# Patient Record
Sex: Female | Born: 1965 | Race: White | Hispanic: No | Marital: Married | Smoking: Never smoker
Health system: Southern US, Community
[De-identification: ages and names within clinical notes are randomized; demographics above are authoritative.]

## PROBLEM LIST (undated history)

## (undated) DIAGNOSIS — F329 Major depressive disorder, single episode, unspecified: Secondary | ICD-10-CM

## (undated) DIAGNOSIS — M722 Plantar fascial fibromatosis: Secondary | ICD-10-CM

## (undated) DIAGNOSIS — F988 Other specified behavioral and emotional disorders with onset usually occurring in childhood and adolescence: Secondary | ICD-10-CM

## (undated) DIAGNOSIS — J45909 Unspecified asthma, uncomplicated: Secondary | ICD-10-CM

## (undated) DIAGNOSIS — F32A Depression, unspecified: Secondary | ICD-10-CM

## (undated) DIAGNOSIS — K219 Gastro-esophageal reflux disease without esophagitis: Secondary | ICD-10-CM

## (undated) DIAGNOSIS — E785 Hyperlipidemia, unspecified: Secondary | ICD-10-CM

## (undated) DIAGNOSIS — G43909 Migraine, unspecified, not intractable, without status migrainosus: Secondary | ICD-10-CM

## (undated) DIAGNOSIS — IMO0001 Reserved for inherently not codable concepts without codable children: Secondary | ICD-10-CM

## (undated) HISTORY — DX: Depression, unspecified: F32.A

## (undated) HISTORY — PX: HEMORROIDECTOMY: SUR656

## (undated) HISTORY — DX: Gastro-esophageal reflux disease without esophagitis: K21.9

## (undated) HISTORY — DX: Hyperlipidemia, unspecified: E78.5

## (undated) HISTORY — DX: Other specified behavioral and emotional disorders with onset usually occurring in childhood and adolescence: F98.8

## (undated) HISTORY — PX: ABDOMINAL HYSTERECTOMY: SHX81

## (undated) HISTORY — PX: COLONOSCOPY: SHX174

## (undated) HISTORY — DX: Reserved for inherently not codable concepts without codable children: IMO0001

## (undated) HISTORY — PX: TONSILLECTOMY: SUR1361

## (undated) HISTORY — DX: Plantar fascial fibromatosis: M72.2

## (undated) HISTORY — PX: CHOLECYSTECTOMY: SHX55

## (undated) HISTORY — DX: Major depressive disorder, single episode, unspecified: F32.9

## (undated) HISTORY — DX: Migraine, unspecified, not intractable, without status migrainosus: G43.909

---

## 1998-03-23 ENCOUNTER — Other Ambulatory Visit: Admission: RE | Admit: 1998-03-23 | Discharge: 1998-03-23 | Payer: Self-pay | Admitting: Obstetrics and Gynecology

## 1998-09-05 ENCOUNTER — Ambulatory Visit (HOSPITAL_COMMUNITY): Admission: RE | Admit: 1998-09-05 | Discharge: 1998-09-05 | Payer: Self-pay | Admitting: Obstetrics and Gynecology

## 1998-11-15 ENCOUNTER — Inpatient Hospital Stay (HOSPITAL_COMMUNITY): Admission: AD | Admit: 1998-11-15 | Discharge: 1998-11-17 | Payer: Self-pay | Admitting: Obstetrics and Gynecology

## 1998-12-13 ENCOUNTER — Other Ambulatory Visit: Admission: RE | Admit: 1998-12-13 | Discharge: 1998-12-13 | Payer: Self-pay | Admitting: Obstetrics and Gynecology

## 1999-12-30 ENCOUNTER — Other Ambulatory Visit: Admission: RE | Admit: 1999-12-30 | Discharge: 1999-12-30 | Payer: Self-pay | Admitting: Gynecology

## 2001-01-11 ENCOUNTER — Other Ambulatory Visit: Admission: RE | Admit: 2001-01-11 | Discharge: 2001-01-11 | Payer: Self-pay | Admitting: Gynecology

## 2001-03-31 ENCOUNTER — Inpatient Hospital Stay (HOSPITAL_COMMUNITY): Admission: AD | Admit: 2001-03-31 | Discharge: 2001-03-31 | Payer: Self-pay | Admitting: Obstetrics & Gynecology

## 2001-04-13 ENCOUNTER — Ambulatory Visit (HOSPITAL_COMMUNITY): Admission: RE | Admit: 2001-04-13 | Discharge: 2001-04-13 | Payer: Self-pay | Admitting: Obstetrics and Gynecology

## 2001-07-15 ENCOUNTER — Inpatient Hospital Stay (HOSPITAL_COMMUNITY): Admission: AD | Admit: 2001-07-15 | Discharge: 2001-07-15 | Payer: Self-pay | Admitting: Obstetrics and Gynecology

## 2001-08-13 ENCOUNTER — Inpatient Hospital Stay (HOSPITAL_COMMUNITY): Admission: AD | Admit: 2001-08-13 | Discharge: 2001-08-13 | Payer: Self-pay | Admitting: Obstetrics and Gynecology

## 2001-09-13 ENCOUNTER — Inpatient Hospital Stay (HOSPITAL_COMMUNITY): Admission: AD | Admit: 2001-09-13 | Discharge: 2001-09-15 | Payer: Self-pay | Admitting: Obstetrics & Gynecology

## 2002-03-07 ENCOUNTER — Other Ambulatory Visit: Admission: RE | Admit: 2002-03-07 | Discharge: 2002-03-07 | Payer: Self-pay | Admitting: Gynecology

## 2002-08-22 ENCOUNTER — Other Ambulatory Visit: Admission: RE | Admit: 2002-08-22 | Discharge: 2002-08-22 | Payer: Self-pay | Admitting: Dermatology

## 2003-03-30 ENCOUNTER — Other Ambulatory Visit: Admission: RE | Admit: 2003-03-30 | Discharge: 2003-03-30 | Payer: Self-pay | Admitting: Gynecology

## 2004-04-01 ENCOUNTER — Other Ambulatory Visit: Admission: RE | Admit: 2004-04-01 | Discharge: 2004-04-01 | Payer: Self-pay | Admitting: Obstetrics and Gynecology

## 2004-07-15 ENCOUNTER — Other Ambulatory Visit: Admission: RE | Admit: 2004-07-15 | Discharge: 2004-07-15 | Payer: Self-pay | Admitting: Dermatology

## 2005-03-19 ENCOUNTER — Ambulatory Visit (HOSPITAL_COMMUNITY): Admission: RE | Admit: 2005-03-19 | Discharge: 2005-03-19 | Payer: Self-pay | Admitting: Family Medicine

## 2005-04-25 ENCOUNTER — Other Ambulatory Visit: Admission: RE | Admit: 2005-04-25 | Discharge: 2005-04-25 | Payer: Self-pay | Admitting: Obstetrics and Gynecology

## 2005-04-28 ENCOUNTER — Other Ambulatory Visit: Admission: RE | Admit: 2005-04-28 | Discharge: 2005-04-28 | Payer: Self-pay | Admitting: Obstetrics and Gynecology

## 2005-11-07 ENCOUNTER — Encounter (INDEPENDENT_AMBULATORY_CARE_PROVIDER_SITE_OTHER): Payer: Self-pay | Admitting: Specialist

## 2005-11-07 ENCOUNTER — Inpatient Hospital Stay (HOSPITAL_COMMUNITY): Admission: RE | Admit: 2005-11-07 | Discharge: 2005-11-09 | Payer: Self-pay | Admitting: Obstetrics and Gynecology

## 2008-10-18 ENCOUNTER — Encounter (HOSPITAL_COMMUNITY): Admission: RE | Admit: 2008-10-18 | Discharge: 2008-11-17 | Payer: Self-pay | Admitting: Podiatry

## 2010-01-17 ENCOUNTER — Encounter: Payer: Self-pay | Admitting: Internal Medicine

## 2010-01-18 ENCOUNTER — Encounter: Payer: Self-pay | Admitting: Internal Medicine

## 2010-02-21 ENCOUNTER — Encounter: Payer: Self-pay | Admitting: Internal Medicine

## 2010-02-27 DIAGNOSIS — R002 Palpitations: Secondary | ICD-10-CM | POA: Insufficient documentation

## 2010-02-27 DIAGNOSIS — I498 Other specified cardiac arrhythmias: Secondary | ICD-10-CM

## 2010-02-27 DIAGNOSIS — I059 Rheumatic mitral valve disease, unspecified: Secondary | ICD-10-CM | POA: Insufficient documentation

## 2010-02-28 ENCOUNTER — Ambulatory Visit: Payer: Self-pay | Admitting: Internal Medicine

## 2010-12-15 ENCOUNTER — Encounter: Payer: Self-pay | Admitting: Podiatry

## 2010-12-26 NOTE — Assessment & Plan Note (Signed)
Summary: NEP/H/O SVT   Visit Type:  Initial Consult Primary Provider:  Lubertha South, MD   History of Present Illness: [name] is referred by Dr. Lubertha South for evaluation of palpitations and SVT.  She has had tachypalpitations dating back almost 20 yrs.  About 6 yrs ago she was working at [employer] and had a bad spell and was documented.  By her report it was SVT at 170/min. which stopped spontaneously.  She also notes an episode 4 weeks ago where she had several days of palpitations which were different then her prior ones in that they appeared to last a few seconds or minutes at a time, and seemed irregular.  These have stopped.  She was given metoprolol to take as needed and this resulted in severe fatigue.  She feels light headed and has chest pressure when she experiences her SVT.  Problems Prior to Update: 1)  Mitral Valve Prolapse  (ICD-424.0) 2)  Supraventricular Tachycardia  (ICD-427.89) 3)  Palpitations  (ICD-785.1)  Current Medications (verified): 1)  Prilosec 20 Mg Cpdr (Omeprazole) .... Take One Tablet By Mouth Once Daily. 2)  Bupropion Hcl 300 Mg Xr24h-Tab (Bupropion Hcl) .... Take One Tablet By Mouth Once Daily. 3)  Adderall Xr 20 Mg Xr24h-Cap (Amphetamine-Dextroamphetamine) .... Take One Tablet By Mouth Once Daily.  Allergies (verified): 1)  ! Vicodin  Past History:  Past Medical History: Last updated: 02/27/2010 Current Problems:  MITRAL VALVE PROLAPSE (ICD-424.0) SUPRAVENTRICULAR TACHYCARDIA (ICD-427.89) PALPITATIONS (ICD-785.1)    Past Surgical History: Last updated: 02/27/2010  1.  Status post laparoscopy x2 for infertility (1995, 1997).  2.  Status post cholecystectomy in 2001.  3.  Status post hemorrhoidectomy.  4.  Status post tonsillectomy in 1971.  Family History: Last updated: 02/27/2010  Coronary artery disease in the patient's mother, maternal  grandmother, maternal grandfather. Diabetes in the patient's mother,  maternal  grandmother, and maternal uncle.  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  She denies the use of tobacco, alcohol, or illicit drugs.  Review of Systems       All systems reviewed and negative except as noted in the HPI.  Vital Signs:  Patient profile:   45 year old female Height:      63 inches Weight:      154 pounds BMI:     27.38 Pulse rate:   83 / minute BP sitting:   136 / 82  (left arm)  Vitals Entered By: Laurance Flatten CMA (February 28, 2010 8:54 AM)  Physical Exam  General:  Well developed, well nourished, in no acute distress.  HEENT: normal Neck: supple. No JVD. Carotids 2+ bilaterally no bruits Cor: RRR no rubs, gallops or murmur Lungs: CTA Ab: soft, nontender. nondistended. No HSM. Good bowel sounds Ext: warm. no cyanosis, clubbing or edema Neuro: alert and oriented. Grossly nonfocal. affect pleasant    EKG  Procedure date:  02/28/2010  Findings:      Normal sinus rhythm with rate of: 83.  Insignificant T-wave inversion noted:     Impression & Recommendations:  Problem # 1:  SUPRAVENTRICULAR TACHYCARDIA (ICD-427.89) The patient is not particularly symptomatic at this time.  I do not have a documented episode of SVT.  Because of side effects on metoprolol, I have asked her to try atenolo on a as needed basis. I will see her back in several months. Her updated medication list for this problem includes:  Atenolol 25 Mg Tabs (Atenolol) .Marland Kitchen... Take one tablet by mouth daily  Orders: EKG w/ Interpretation ([address])  Problem # 2:  PALPITATIONS (ICD-785.1) Her episode last month is suggestive of pac's or pvc's thought I could not rule out atrial fibrillation. Her updated medication list for this problem includes:    Atenolol 25 Mg Tabs (Atenolol) .Marland Kitchen... Take one tablet by mouth daily  Patient Instructions: 1)  Your physician recommends that you schedule a follow-up appointment in: 4 months in the  [place] office. 2)  Your physician has recommended you make the following change in your medication: Start Atenolol 25mg  once daily as needed for your symptoms. Prescriptions: ATENOLOL 25 MG TABS (ATENOLOL) Take one tablet by mouth daily  #30 x 3   Entered by:   Sherri Rad, RN, BSN   Authorized by:   Laren Boom, MD, [facility]   Signed by:   Sherri Rad, RN, BSN on 02/28/2010   Method used:   Electronically to        [facility] (retail)       [address]       [place], [place]  [address]       Ph: [phone] or [phone]       Fax: [phone]   RxID:   [id number]

## 2010-12-26 NOTE — Consult Note (Signed)
Summary: [facility] Referral Form  [facility] Referral Form   Imported By: Roderic Ovens 03/07/2010 13:45:58  _____________________________________________________________________  External Attachment:    Type:   Image     Comment:   External Document

## 2011-04-11 NOTE — H&P (Signed)
NAMELAURELIN, [name]            ACCOUNT NO.:  [id number]   MEDICAL RECORD NO.:  [id number]          PATIENT TYPE:  INP   LOCATION:  NA                            [facility]:  [facility]   PHYSICIAN:  [name], M.D.   DATE OF BIRTH:  November 21, 1966   DATE OF ADMISSION:  11/07/2005  DATE OF DISCHARGE:                                HISTORY & PHYSICAL   Physical examination was performed October 21, 2005.   HISTORY OF PRESENT ILLNESS:  The patient is a 45 year old gravida 4, para 2,  0, 2, 2, Caucasian female who presents with a complaint of urinary  incontinence and irregular vaginal bleeding. The patient reports a history  of irregular vaginal bleeding with menses that can last up to 13 days. The  patient has tried multiple birth control pills and has problems with break-  through bleeding. She had Gonorrhea/Chlamydia cervical cultures which were  both negative on April 25, 2005. She had an ultrasound performed June 02, 2005  which documented a normal uterus with an endometrial stripe measuring 0.39  cm. The ovaries were unremarkable. An endometrial biopsy performed October 21, 2005 documented proliferative endometrium with no evidence of  hyperplasia nor malignancy.   The patient also reports urinary leakage with coughing and sneezing and  stressful maneuvers. She did undergo multi-channel urodynamic testing which  documented a uroflow study with a void of 485 cc and a post void residual of  170 cc. The pattern was intermittent. On the cystometric study, the patient  had a leak point pressure of 108 cm of water. The detrusor was noted to be  unstable. On her pressure flow study she was noted to have a pattern of  valsalva voiding. Her maximum detrusor pressure was 59 cm of water.   The patient declines any future childbearing and she desires definitive  treatment for her irregular vaginal bleeding and her urinary incontinence.   The patient's past obstetric and gynecologic histories  are remarkable for  two vaginal deliveries (1999, 2002). The patient is status post spontaneous  abortion in 56. She is status post voluntary interruption of pregnancy in  1996 for a fetus with Turner's syndrome and cystic hygroma.   The patient's last Pap smear was performed April 25, 2005 and was within  normal limits. Her last mammogram was performed April 25, 2005 and was also  within normal limits.   PAST MEDICAL HISTORY:  1.  Paroxysmal supraventricular tachycardia. The patient also has a history      of mild anterior mitral valve prolapse with mild regurgitation. This was      confirmed by a recent echocardiogram on September 29, 2005 by the      patient's cardiologist, Dr. Laural Golden.  The patient is also status post cardiac catheterization which was normal.  1.  Migraine headaches.  2.  Gastroesophageal reflux disease.  3.  History of gestational diabetes.  4.  Adult attention deficit disorder and anxiety.   PAST SURGICAL HISTORY:  1.  Status post laparoscopy x2 for infertility (1995, 1997).  2.  Status post cholecystectomy in 2001.  3.  Status post hemorrhoidectomy.  4.  Status post tonsillectomy in 1971.   MEDICATIONS:  1.  Prilosec.  2.  Effexor XR 225 mg p.o. daily.  3.  Baclofen p.r.n.  4.  Migraine headache medications for which the patient does not remember      the name.   ALLERGIES:  TERA ZOLE, VICODIN, CLOMID.   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  She denies the use of tobacco, alcohol, or illicit drugs.   FAMILY HISTORY:  Coronary artery disease in the patient's mother, maternal  grandmother, maternal grandfather. Diabetes in the patient's mother,  maternal grandmother, and maternal uncle.   PHYSICAL EXAMINATION:  VITAL SIGNS:  Height 5 feet, 3 inches, weight 168  pounds.  HEENT:  Normocephalic, atraumatic.  LUNGS:  Clear to auscultation bilaterally.  HEART:  S1, S2 with a regular rate and rhythm.  ABDOMEN:   Soft and nontender and without evidence of hepatosplenomegaly or  organomegaly.  PELVIC EXAMINATION:  Normal external genitalia and urethra. There is a  second degree cystocele, first degree uterine prolapse, and a second degree  rectocele. The uterus is noted to be small and nontender and there is no  evidence of any adnexal masses, no tenderness.   IMPRESSION:  The patient is a 45 year old para 2 female with pelvic organ  prolapse, genuine stress incontinence, and a history of irregular bleeding  which has not been well controlled with birth control pills. The patient  also has a history of supraventricular tachycardia with mild mitral valve  prolapse and regurgitation.   PLAN:  The patient will undergo a total vaginal hysterectomy with TVT,  cystoscopy, and anterior and posterior colporrhaphy at [facility] on November 07, 2005. The patient will be pre-treated with  both ampicillin and gentamicin for mitral valve prolapse. Risks, benefits,  and alternatives to the surgery have been discussed with the patient who  wishes to proceed.      [name], M.D.  Electronically Signed     BES/MEDQ  D:  11/06/2005  T:  11/06/2005  Job:  [id number]

## 2011-04-11 NOTE — Op Note (Signed)
NAMERANYIA, WITTING            ACCOUNT NO.:  [id number]   MEDICAL RECORD NO.:  [id number]          PATIENT TYPE:  INP   LOCATION:  9399                          [facility]ILITY:  [facility]   PHYSICIAN:  Randye Lobo, M.D.   DATE OF BIRTH:  1966/03/06   DATE OF PROCEDURE:  11/07/2005  DATE OF DISCHARGE:                                 OPERATIVE REPORT   PREOPERATIVE DIAGNOSIS:  1.  Genuine stress incontinence.  2.  Incomplete uterovaginal prolapse.  3.  Irregular vaginal bleeding.   POSTOPERATIVE DIAGNOSIS:  1.  Genuine stress incontinence.  2.  Pelvic organ prolapse, incomplete uterovaginal prolapse.  3.  Irregular vaginal bleeding.   PROCEDURE:  Total vaginal hysterectomy, McCall's culdoplasty, anterior and  posterior colporrhaphy, Tension Free Vaginal Tape and Cystoscopy   SURGEON:  Conley Simmonds, M.D.   ASSISTANT:  Carrington Clamp, M.D.   ANESTHESIA:  General endotracheal, local with 0.5% lidocaine with 1:200,000  of epinephrine.   IV FLUIDS:  2500 mL Ringer's lactate.   ESTIMATED BLOOD LOSS:  300 mL.   URINE OUTPUT:  500 mL.   INDICATIONS FOR PROCEDURE:  The patient is a 45 year old gravida 4, para 2-0-  2-2 Caucasian female who presented with urinary incontinence and irregular  vaginal bleeding. The patient had multichannel urodynamic testing performed  which documented genuine stress incontinence. She had a complete workup done  for her irregular vaginal bleeding which was negative. The patient has  continued to have irregular vaginal bleeding with first degree uterine  prolapse and a second degree cystocele and rectocele.   The patient declined any future childbearing and she wished for definitive  surgical treatment of the prolapse, incontinence, and bleeding and a plan  was made to proceed after risks, benefits, and alternatives were discussed  with her.   FINDINGS:  Examination revealed a second-degree uterine prolapse, second-  degree rectocele, and a second  degree cystocele. At the time of hysterectomy  the patient was noted to have a normal uterus, tubes and ovaries. There was  a 1 cm left corpus luteum cyst appreciated. Cystoscopy both during and after  completion of the tension-free vaginal tape sling documented the absence of  a foreign body in either the urethra or the bladder. The ureters were noted  to be patent bilaterally after the injection of indigo carmine dye. The  bladder was visualized throughout 360 degrees and had a normal bladder dome  and a valve prolapse with mild regurgitation.    The patient was reidentified in the pre-op hold area.  She received  Ampicilin and Gentamicin for Mitral Valve Prolapse.  The patient also  received both TED hose and PAS stockings for DVT prophylaxis.   In the operating room, general endotracheal anesthesia was induced and the  patient was then placed in the dorsal lithotomy position. The lower abdomen  and the perineum were then sterilely prepped and draped and a Foley catheter  was placed inside the bladder.   A weighted speculum was placed inside the vagina and a single-tooth  tenaculum and a Jacobs tenaculum where placed on the cervical lips.  The  cervix  was then injected with an 0.5% lidocaine with 1:200,000 of  epinephrine. The cervix was then circumscribed with the scalpel. A Mayo  scissors was used to carry the dissection down to the level of the  endopelvic fascia along the cervix.  The posterior cul de sac was entered  sharply and digital exam confirmed proper entry into this space.  A suture  was used to tag the peritoneum to the posterior vagina when the speculum was  then placed inside the posterior cul-de-sac. The uterosacral ligaments were  sequentially clamped, sharply divided, and suture ligated with a transfixing  suture of 0-0 Vicryl. A second clamp was placed across the upper portion of  the uterosacral ligaments bilaterally and again these were sharply divided  and  suture ligated with 0 Vicryl. The cardinal ligaments and the uterine  arteries were then clamped, sharply divided, and suture ligated with 0  Vicryl. The inferior aspects of the broad ligaments were then clamped,  sharply divided, and again suture ligated with 0 Vicryl bilaterally. The  utero-ovarian ligaments, fallopian tubes and the round ligaments were then  clamped bilaterally, sharply divided, and free tied with 0/0 vicryl and then  suture ligated with the same for excellent hemostasis. The specimen was  removed and was sent to pathology. There was some bleeding noted along the  vagina at the level of the uterosacral ligaments bilaterally. This responded  to cautery on the patient's left-hand side. On the patient's right-hand  side, a right angle clamp was placed and the tissue just above the right  uterosacral ligament was first free tied and was then suture ligated with a  figure-of-eight suture of 0 Vicryl to create good hemostasis.   The posterior vaginal cuff was then whip stitched with 0 Vicryl.  A McCall  culdoplasty was performed next with a suture of 0 Vicryl. This was brought  through the vagina and into the posterior cul-de-sac at the 6 o'clock  position, up through the uterosacral ligament on the patient's left-hand  side, across the posterior cul-de-sac in a pursestring fashion and then down  through the uterosacral ligaments on the patient's right-hand side before  coming out of the vagina at the 6 o'clock position. The suture was held  until the end of the case at which time it was tied for excellent elevation  and support of the vaginal cuff.   The anterior colporrhaphy and the TVT were performed next. The peritoneum  was not closed prior to this. The vaginal mucosa was marked with Allis  clamps in the midline from the level of the vagina all the way up to  approximately 1.5 cm below the urethral meatus. The vaginal tissue was injected with 0.5% lidocaine was  1:200,000 of epinephrine. The vagina was  then incised vertically in the midline with a Metzenbaum scissors. The  subvaginal tissue and bladder were dissected off of the overlying vaginal  mucosa bilaterally all the way back to the pubic rami and down to the level  of the upper pedicles for the vaginal hysterectomy. Hemostasis was created.   The TVT was performed next. The small 1-cm suprapubic incisions were then  created at 2 cm to the right and the left of the midline using a scalpel.  With the Foley catheter in place, the right needle passer was placed first.  The TVT was performed in a top-down fashion. The abdominal needle passer was  placed through the right suprapubic incision and out through the vagina  lateral to the  urethra at its mid portion. The same procedure that was  performed on the patient's right-hand side was then repeated on the left-  hand side, again in a top-down fashion. The Foley catheter was removed and  cystoscopy was performed and the findings were as noted above. The  cystoscopy fluid was then drained from the bladder and the Foley catheter  was replaced and the sling was attached to the abdominal needle passers and  drawn up through the suprapubic incisions bilaterally. The abdominal needle  passers were cut free from the sling. The Foley catheter was removed and the  final cystoscopy was performed. There was no evidence of a foreign body in  the bladder or urethra. Again the cystoscopy fluid was drained and the Foley  catheter was replaced. The plastic sheaths were removed from the sling as a  Kelly clamp was placed underneath the sling between the sling and the  urethra. The excess sling material was then excised and the suprapubic  incisions were closed with Dermabond. The anterior colporrhaphy was  performed by placing a series of first 2-0 Vicryl vertical mattress sutures  which transitioned to 0 Vicryl vertical mattress sutures to reduce the  cystocele.  Excess anterior vaginal mucosa was then excised and the anterior  vaginal wall including the vagina were then closed with a running locked  suture of 2-0 Vicryl.   The posterior colporrhaphy was performed last. Allis clamps were again used  to mark the midline of the posterior vaginal wall. The perineum and the  posterior vaginal mucosa were injected with half percent lidocaine with  1:200,000 epinephrine. A triangular wedge of epithelium was removed from the  perineal body and the posterior vagina was incised vertically using the  Metzenbaum scissors. The perirectal fascia was dissected off of the  overlying vaginal mucosa bilaterally all the way to the level of the [employer] (just below this). The posterior colporrhaphy was performed by  placing a small pursestring suture as the first suture of the colporrhaphy.  2-0 Vicryl was then used to place vertical mattress sutures to reduce the rectocele. At the level of the mid posterior vagina vertical mattress  sutures of 0 Vicryl were used to reduce the rectocele. Excess vaginal mucosa  was then trimmed and the posterior vagina was closed with a running locked  suture of 2-0 Vicryl. A crown stitch of 0 Vicryl was placed at the perineal  body. The closure along the perineum was a subcuticular closure as for an  episiotomy. The knot was tied at the level of the hymen. The McCall  culdoplasty suture was tied at this time for excellent elevation and support  of the vaginal cuff.   Final rectal exam documented the absence of sutures in the rectum. A packing  with Estrace cream was then placed inside the vagina. The patient was  cleansed of any remaining Betadine and taken out of the dorsal lithotomy  position. She was awakened and extubated and escorted to the recovery room  in stable and awake condition. There were no complications to the procedure.  All needle, instrument, and sponge counts were correct.      Randye Lobo,  M.D.  Electronically Signed     BES/MEDQ  D:  11/07/2005  T:  11/09/2005  Job:  [id number]

## 2011-04-11 NOTE — Discharge Summary (Signed)
[name], [name]            ACCOUNT NO.:  [id number]   MEDICAL RECORD NO.:  [id number]          PATIENT TYPE:  INP   LOCATION:  9319                          FACILITY:  [facility]   PHYSICIAN:  Randye Lobo, M.D.   DATE OF BIRTH:  Aug 06, 1966   DATE OF ADMISSION:  11/07/2005  DATE OF DISCHARGE:  11/09/2005                                 DISCHARGE SUMMARY   ADMISSION DIAGNOSES:  1.  Genuine stress incontinence.  2.  Incomplete uterovaginal prolapse.  3.  Irregular vaginal bleeding.   POSTOPERATIVE DIAGNOSES:  1.  Genuine stress incontinence.  2.  Incomplete uterovaginal prolapse.  3.  Irregular vaginal bleeding.  4.  Postoperative urinary retention.   SIGNIFICANT OPERATIONS AND PROCEDURES:  The patient underwent a total  vaginal hysterectomy with tension-free vaginal tape, cystoscopy, McCall  culdoplasty, and anterior and posterior colporrhaphy under the direction of  Randye Lobo, M.D., and with the assistance of Carrington Clamp, M.D., at  the [facility] of [place] one November 07, 2005.   ADMISSION HISTORY AND PHYSICAL EXAMINATION:  The patient is a 45 year old  gravida 4, para 2-0-2-2, Caucasian female who presented with urinary  incontinence and irregular vaginal bleeding.  The patient had urinary  leakage with coughing and sneezing on stressful maneuvers, and she had in  the genuine stress incontinence confirmed with urodynamic testing.  The  patient had a workup of her irregular vaginal bleeding, which was also  negative.  The patient had negative cervical cultures and a negative  ultrasound.  An endometrial biopsy documented proliferative endometrium with  no evidence of hyperplasia nor malignancy.  The patient's bleeding was not  controlled well with oral contraceptives.   The patient requested definitive surgical treatment of her prolapse and  incontinence and as she declined future childbearing, a plan was made to  proceed with a total vaginal  hysterectomy with TVT, cystoscopy, anterior and  posterior colporrhaphy and McCall culdoplasty.   ADMISSION PELVIC EXAMINATION:  Normal external genitalia and urethra.  There  was a second-degree cystocele, first-degree uterine prolapse, and a second  degree rectocele.  The uterus was small and nontender, and there were no  adnexal masses or tenderness appreciated.   HOSPITAL COURSE:  The patient was admitted on November 07, 2005, at which  time she underwent a total vaginal hysterectomy with Rogelio Seen culdoplasty,  TVT, cystoscopy, and anterior and posterior colporrhaphy, which was  uncomplicated.  Findings at surgery document a second-degree cystocele,  second-degree uterine prolapse, and a second-degree rectocele.  The uterus,  tubes and ovaries were unremarkable.  There was a small left corpus luteum  cyst.  Cystoscopy documented the absence of a foreign body in the urethra or  bladder, and the ureters were patent bilaterally.   Postoperatively, the patient's pain was controlled with a morphine PCA and  Toradol.  She was converted over to oral pain medication by postoperative  day #1.  The patient began her bladder training on postoperative day #1, and  she had good voided volumes but high residuals of several hundred  milliliters.  The Foley catheter was therefore placed overnight, the patient  began doing her voiding trials again on postoperative day #2.  Again her  postvoid residuals were noted to be high and she was experiencing some  dysuria from the catheterizations, and the process was therefore  discontinued and she was placed on oral Macrobid, which she will continue at  the time of her discharge.   By the time of the patient's discharge, she was ambulating independently and  she was tolerating a regular diet.   Her discharge hemoglobin was noted to be 11.8.  She did have a leukocytosis  of 18,000, which was of an unknown etiology.  The patient had received  ampicillin and  gentamicin preoperatively and then oral amoxicillin  postoperatively for mitral valve prolapse with mild regurgitation.  She was  afebrile throughout her hospitalization and demonstrated no signs of obvious  infection.   The patient was noted to be in good condition and ready for discharge on  postop day #2.   DISCHARGE INSTRUCTIONS:  1.  Discharge to home.  2.  The patient will take Percocet 5/325 mg one to two p.o. q.4-6h. p.r.n.      pain.  She will also take ibuprofen 600 mg q.6h. p.r.n. pain.  Macrobid      100 mg p.o. b.i.d..  3.  She will leave her Foley catheter to gravity drainage.  4.  The patient will follow a regular diet.  5.  The patient had decreased activity.  She will not drive for two weeks.  6.  The patient will follow up in the office the day after her discharge.  7.  The patient will call if she experiences any problems with fever, pain      uncontrolled by her medication, nausea and vomiting, heavy vaginal      bleeding, hematuria, or any other concern.      Randye Lobo, M.D.  Electronically Signed     BES/MEDQ  D:  12/15/2005  T:  12/16/2005  Job:  [id number]

## 2011-07-21 ENCOUNTER — Other Ambulatory Visit: Payer: Self-pay | Admitting: Obstetrics and Gynecology

## 2013-04-08 ENCOUNTER — Ambulatory Visit (INDEPENDENT_AMBULATORY_CARE_PROVIDER_SITE_OTHER): Payer: BC Managed Care – PPO | Admitting: Family Medicine

## 2013-04-08 ENCOUNTER — Encounter: Payer: Self-pay | Admitting: Family Medicine

## 2013-04-08 VITALS — BP 110/78 | Temp 97.9°F | Ht 63.0 in | Wt 153.0 lb

## 2013-04-08 DIAGNOSIS — J322 Chronic ethmoidal sinusitis: Secondary | ICD-10-CM

## 2013-04-08 MED ORDER — CEFPROZIL 500 MG PO TABS: 500.0000 mg | ORAL_TABLET | Freq: Two times a day (BID) | ORAL | Status: DC

## 2013-04-08 NOTE — Progress Notes (Signed)
  Subjective:    Patient ID: [name], female    DOB: [birth year], 47 y.o.   MRN: [id number]  Sinusitis This is a recurrent problem. The current episode started 1 to 4 weeks ago. The problem has been gradually worsening since onset. There has been no fever. The pain is moderate. Associated symptoms include a hoarse voice, sinus pressure, sneezing and a sore throat (started with sore throatr--took zyrtec plus flonase). Past treatments include nothing.   Used inhaler one night. Tightness at times.   Review of Systems  HENT: Positive for sore throat (started with sore throatr--took zyrtec plus flonase), hoarse voice, sneezing and sinus pressure.   most sym upper othrwise neg ROS otherwise negative    Objective:   Physical Exam Alert mild malaise. Vitals reviewed. HEENT moderate nasal congestion frontal tenderness. Pharynx normal neck supple. Lungs clear. Heart regular in rhythm.       Assessment & Plan:  Impression sinusitis with reactive airways. Plan albuterol when necessary. Cefzil 500 by mouth twice a day 10 days. Symptomatic care discussed. WSL

## 2013-04-08 NOTE — Patient Instructions (Signed)
Tale all the antibiotics

## 2013-05-11 ENCOUNTER — Encounter: Payer: Self-pay | Admitting: *Deleted

## 2013-05-12 ENCOUNTER — Encounter: Payer: Self-pay | Admitting: Family Medicine

## 2013-05-12 ENCOUNTER — Ambulatory Visit (INDEPENDENT_AMBULATORY_CARE_PROVIDER_SITE_OTHER): Payer: BC Managed Care – PPO | Admitting: Family Medicine

## 2013-05-12 VITALS — BP 112/80 | HR 88 | Wt 149.0 lb

## 2013-05-12 DIAGNOSIS — K219 Gastro-esophageal reflux disease without esophagitis: Secondary | ICD-10-CM | POA: Insufficient documentation

## 2013-05-12 DIAGNOSIS — F329 Major depressive disorder, single episode, unspecified: Secondary | ICD-10-CM | POA: Insufficient documentation

## 2013-05-12 DIAGNOSIS — F909 Attention-deficit hyperactivity disorder, unspecified type: Secondary | ICD-10-CM

## 2013-05-12 DIAGNOSIS — G43909 Migraine, unspecified, not intractable, without status migrainosus: Secondary | ICD-10-CM | POA: Insufficient documentation

## 2013-05-12 MED ORDER — RIZATRIPTAN BENZOATE 10 MG PO TABS: 10.0000 mg | ORAL_TABLET | ORAL | Status: DC | PRN

## 2013-05-12 NOTE — Progress Notes (Signed)
  Subjective:    Patient ID: [name], female    DOB: 24-Jul-1966, 47 y.o.   MRN: [id number]  HPI Overall doing better with the focusing.  More control of symptoms. No obvious side effects with the medicine. Definitely states the higher dose is helping.  Line dancing on occasion Busy at school--fell off school  Reflux stable as of late--prilosec definitely helped. Sleep ok.   Migraine headaches have been a challenge. Tradename Maxalt seemed to help more, but is extremely costly. Patient would like to go back to try and generic.  Patient reports her mood is stable on the medication. Looking for to have summer off to spend time with family.  Review of Systems No abdominal pain no vomiting no reflux no weight loss or weight gain ROS otherwise negative    Objective:   Physical Exam Alert no acute distress. Vitals stable. HEENT normal. Lungs clear. Heart regular in rhythm. Neuro intact.       Assessment & Plan:  Impression #1 migraine headaches discussed at length. #2 ADHD clinically improved on new dose. #3 reflux stable while on medicines. #4 depression clinically stable. Plan diet exercise discussed in encourage maintain all medications for refills on Adderall XR filled out recheck in 4 months. WSL

## 2013-08-01 ENCOUNTER — Other Ambulatory Visit: Payer: Self-pay | Admitting: *Deleted

## 2013-08-01 MED ORDER — CETIRIZINE HCL 10 MG PO CAPS: 10.0000 mg | ORAL_CAPSULE | Freq: Every day | ORAL | Status: DC

## 2013-08-26 ENCOUNTER — Other Ambulatory Visit: Payer: Self-pay

## 2013-09-05 ENCOUNTER — Other Ambulatory Visit: Payer: Self-pay

## 2013-09-05 ENCOUNTER — Telehealth: Payer: Self-pay | Admitting: Family Medicine

## 2013-09-05 MED ORDER — BUPROPION HCL ER (XL) 300 MG PO TB24: 300.0000 mg | ORAL_TABLET | Freq: Every day | ORAL | Status: DC

## 2013-09-05 NOTE — Telephone Encounter (Signed)
Patient needs Rx for wellbutrin ASAP She has been out all weekend because pharmacy said that she was not in out system, but she is.  She would like someone to clarify with pharmacy the name that we have on file vs. Theirs.  [facility]

## 2013-09-05 NOTE — Telephone Encounter (Signed)
Medication sent in to pharmacy. Patient was notified.  

## 2013-09-12 ENCOUNTER — Ambulatory Visit (INDEPENDENT_AMBULATORY_CARE_PROVIDER_SITE_OTHER): Payer: BC Managed Care – PPO | Admitting: Family Medicine

## 2013-09-12 ENCOUNTER — Encounter: Payer: Self-pay | Admitting: Family Medicine

## 2013-09-12 VITALS — BP 112/74 | Temp 98.1°F | Ht 63.0 in | Wt 153.3 lb

## 2013-09-12 DIAGNOSIS — J019 Acute sinusitis, unspecified: Secondary | ICD-10-CM

## 2013-09-12 MED ORDER — OMEPRAZOLE 20 MG PO CPDR: 20.0000 mg | DELAYED_RELEASE_CAPSULE | Freq: Two times a day (BID) | ORAL | Status: DC

## 2013-09-12 MED ORDER — LEVOFLOXACIN 500 MG PO TABS: 500.0000 mg | ORAL_TABLET | Freq: Every day | ORAL | Status: DC

## 2013-09-12 MED ORDER — BUPROPION HCL ER (XL) 300 MG PO TB24: 300.0000 mg | ORAL_TABLET | Freq: Every day | ORAL | Status: DC

## 2013-09-12 MED ORDER — AMPHETAMINE-DEXTROAMPHET ER 20 MG PO CP24: 40.0000 mg | ORAL_CAPSULE | Freq: Every morning | ORAL | Status: DC

## 2013-09-12 NOTE — Progress Notes (Signed)
  Subjective:    Patient ID: [name], female    DOB: 26-Apr-1966, 47 y.o.   MRN: [id number]  Sinusitis This is a new problem. The current episode started 1 to 4 weeks ago. The problem has been gradually improving since onset. There has been no fever. Associated symptoms include congestion, coughing and sinus pressure. Pertinent negatives include no ear pain or shortness of breath. (Ear congestion) Past treatments include oral decongestants and spray decongestants. The treatment provided mild relief.   Had presyncope spells last week. Patient states she ran out of her Wellbutrin a cause some significant issues she got restarted on Wellbutrin and she's been doing much better. Past medical history benign patient does not smoke   Review of Systems  Constitutional: Negative for fever and activity change.  HENT: Positive for congestion, rhinorrhea and sinus pressure. Negative for ear pain.   Eyes: Negative for discharge.  Respiratory: Positive for cough. Negative for shortness of breath and wheezing.   Cardiovascular: Negative for chest pain.       Objective:   Physical Exam  Constitutional: She is oriented to person, place, and time. She appears well-developed and well-nourished.  HENT:  Head: Normocephalic.  Right Ear: External ear normal.  Left Ear: External ear normal.  Eyes: Pupils are equal, round, and reactive to light.  Neck: Normal range of motion. No thyromegaly present.  Cardiovascular: Normal rate, regular rhythm, normal heart sounds and intact distal pulses.   No murmur heard. Pulmonary/Chest: Effort normal and breath sounds normal. No respiratory distress. She has no wheezes.  Abdominal: Soft. Bowel sounds are normal. She exhibits no distension and no mass. There is no tenderness.  Musculoskeletal: Normal range of motion. She exhibits no edema and no tenderness.  Lymphadenopathy:    She has no cervical adenopathy.  Neurological: She is alert and oriented to  person, place, and time. She exhibits normal muscle tone.  Skin: Skin is warm and dry.  Psychiatric: She has a normal mood and affect. Her behavior is normal.          Assessment & Plan:  Acute sinusitis-antibiotics prescribed. Warning signs discussed. Followup if ongoing issues.  ADD-one additional month was given. To followup by the time this is finishing up for her standard ADD visit.

## 2013-09-16 ENCOUNTER — Other Ambulatory Visit (HOSPITAL_COMMUNITY): Payer: Self-pay | Admitting: Podiatry

## 2013-09-16 DIAGNOSIS — M8430XA Stress fracture, unspecified site, initial encounter for fracture: Secondary | ICD-10-CM

## 2013-09-20 ENCOUNTER — Ambulatory Visit (HOSPITAL_COMMUNITY)
Admission: RE | Admit: 2013-09-20 | Discharge: 2013-09-20 | Disposition: A | Discharge status: Home / Self Care | Payer: BC Managed Care – PPO | Source: Ambulatory Visit | Attending: Podiatry | Admitting: Podiatry

## 2013-09-20 DIAGNOSIS — M9420 Chondromalacia, unspecified site: Secondary | ICD-10-CM | POA: Insufficient documentation

## 2013-09-20 DIAGNOSIS — M8430XA Stress fracture, unspecified site, initial encounter for fracture: Secondary | ICD-10-CM

## 2013-09-20 DIAGNOSIS — M25579 Pain in unspecified ankle and joints of unspecified foot: Secondary | ICD-10-CM | POA: Insufficient documentation

## 2013-10-18 ENCOUNTER — Other Ambulatory Visit: Payer: Self-pay | Admitting: Family Medicine

## 2013-11-04 ENCOUNTER — Ambulatory Visit (INDEPENDENT_AMBULATORY_CARE_PROVIDER_SITE_OTHER): Payer: BC Managed Care – PPO | Admitting: Nurse Practitioner

## 2013-11-04 ENCOUNTER — Encounter: Payer: Self-pay | Admitting: Nurse Practitioner

## 2013-11-04 VITALS — BP 130/70 | Ht 63.0 in | Wt 161.4 lb

## 2013-11-04 DIAGNOSIS — R5381 Other malaise: Secondary | ICD-10-CM

## 2013-11-04 DIAGNOSIS — R300 Dysuria: Secondary | ICD-10-CM

## 2013-11-04 DIAGNOSIS — F909 Attention-deficit hyperactivity disorder, unspecified type: Secondary | ICD-10-CM

## 2013-11-04 LAB — POCT UA - MICROSCOPIC ONLY
Bacteria, U Microscopic: 0
RBC, urine, microscopic: 0
WBC, Ur, HPF, POC: 0

## 2013-11-04 LAB — POCT URINALYSIS DIPSTICK

## 2013-11-04 MED ORDER — AMPHETAMINE-DEXTROAMPHET ER 20 MG PO CP24: 40.0000 mg | ORAL_CAPSULE | Freq: Every morning | ORAL | Status: DC

## 2013-11-04 MED ORDER — NITROFURANTOIN MONOHYD MACRO 100 MG PO CAPS: 100.0000 mg | ORAL_CAPSULE | Freq: Two times a day (BID) | ORAL | Status: DC

## 2013-11-09 ENCOUNTER — Encounter: Payer: Self-pay | Admitting: Nurse Practitioner

## 2013-11-09 NOTE — Assessment & Plan Note (Signed)
Continue Adderall as directed. Recheck in 3 months.

## 2013-11-09 NOTE — Progress Notes (Signed)
Subjective:  Presents complaints of slight drainage most likely from her urethra for over week. No fever or chills. Clear to slightly yellowish in color. Slight urgency and frequency but no dysuria. Minimal lower pelvic area tenderness. Had recent STD testing done for her gynecologist in October which was all negative. No history of recent UTI. No back or flank pain. Has increased her alcohol and sugar intake. Some weight gain. Has been under a lot more stress. No change in the number of her migraines. Also complaints of slight jumping around the eye area. No visual changes. Current dose of Adderall is working well for her ADD. Denies any adverse affects.  Objective:   BP 130/70  Ht 5\' 3"  (1.6 m)  Wt 161 lb 6.4 oz (73.211 kg)  BMI 28.60 kg/m2 NAD. Alert, oriented. Thyroid normal limit to palpation and nontender. Lungs clear. No CVA area tenderness. No flank tenderness. Heart regular rate rhythm. Abdomen soft nondistended nontender. Urine microscopic negative.  Assessment:Dysuria - Plan: POCT urinalysis dipstick, POCT UA - Microscopic Only, Urine culture  Other malaise and fatigue - Plan: TSH  ADHD (attention deficit hyperactivity disorder)   Plan: Meds ordered this encounter  Medications  . DISCONTD: amphetamine-dextroamphetamine (ADDERALL XR) 20 MG 24 hr capsule    Sig: Take 2 capsules (40 mg total) by mouth every morning. Take 2 tablets daily (40 mg)    Dispense:  60 capsule    Refill:  0    Order Specific Question:  Supervising Provider    Answer:  Merlyn Albert [[id number]]  . DISCONTD: amphetamine-dextroamphetamine (ADDERALL XR) 20 MG 24 hr capsule    Sig: Take 2 capsules (40 mg total) by mouth every morning. Take 2 tablets daily (40 mg)    Dispense:  60 capsule    Refill:  0    May fill 30 days from 11/04/13    Order Specific Question:  Supervising Provider    Answer:  Merlyn Albert [[id number]]  . amphetamine-dextroamphetamine (ADDERALL XR) 20 MG 24 hr capsule    Sig: Take 2  capsules (40 mg total) by mouth every morning. Take 2 tablets daily (40 mg)    Dispense:  60 capsule    Refill:  0    May fill 60 days from 11/04/13    Order Specific Question:  Supervising Provider    Answer:  Merlyn Albert [[id number]]  . nitrofurantoin, macrocrystal-monohydrate, (MACROBID) 100 MG capsule    Sig: Take 1 capsule (100 mg total) by mouth 2 (two) times daily.    Dispense:  14 capsule    Refill:  0    Order Specific Question:  Supervising Provider    Answer:  Merlyn Albert [[id number]]   urine culture pending. Recheck here or with gynecologist if her symptoms persist. Recheck in 3 months for ADHD. Discussed importance of stress reduction; this is the most likely cause for twitching in her eye.

## 2014-02-17 ENCOUNTER — Telehealth: Payer: Self-pay | Admitting: Family Medicine

## 2014-02-17 MED ORDER — AMPHETAMINE-DEXTROAMPHET ER 20 MG PO CP24: 40.0000 mg | ORAL_CAPSULE | Freq: Every morning | ORAL | Status: DC

## 2014-02-17 NOTE — Telephone Encounter (Signed)
Ok time one mo ov before further

## 2014-02-17 NOTE — Telephone Encounter (Signed)
Pt needs refill on her Adderall 20mg  XR, takes 2 every morning.  Unable to get appt in before runs out due to our schedule availability.

## 2014-02-20 NOTE — Telephone Encounter (Signed)
Patient notified

## 2014-02-24 ENCOUNTER — Telehealth: Payer: Self-pay | Admitting: Family Medicine

## 2014-02-24 NOTE — Telephone Encounter (Signed)
Rx Prior Auth obtained for amphetamine-dextroamphetamine (ADDERALL XR) 20 MG 24 hr capsule, 2 by mouth every morning, expires 02/24/2015 through [employer], faxed approval to [facility]

## 2014-03-10 ENCOUNTER — Encounter: Payer: Self-pay | Admitting: Family Medicine

## 2014-03-10 ENCOUNTER — Ambulatory Visit (INDEPENDENT_AMBULATORY_CARE_PROVIDER_SITE_OTHER): Payer: BC Managed Care – PPO | Admitting: Family Medicine

## 2014-03-10 VITALS — BP 130/90 | Ht 63.0 in | Wt 159.0 lb

## 2014-03-10 DIAGNOSIS — K219 Gastro-esophageal reflux disease without esophagitis: Secondary | ICD-10-CM

## 2014-03-10 DIAGNOSIS — F32A Depression, unspecified: Secondary | ICD-10-CM

## 2014-03-10 DIAGNOSIS — F3289 Other specified depressive episodes: Secondary | ICD-10-CM

## 2014-03-10 DIAGNOSIS — F329 Major depressive disorder, single episode, unspecified: Secondary | ICD-10-CM

## 2014-03-10 DIAGNOSIS — F909 Attention-deficit hyperactivity disorder, unspecified type: Secondary | ICD-10-CM

## 2014-03-10 DIAGNOSIS — G43909 Migraine, unspecified, not intractable, without status migrainosus: Secondary | ICD-10-CM

## 2014-03-10 DIAGNOSIS — I7300 Raynaud's syndrome without gangrene: Secondary | ICD-10-CM

## 2014-03-10 MED ORDER — AMPHETAMINE-DEXTROAMPHET ER 20 MG PO CP24: 40.0000 mg | ORAL_CAPSULE | Freq: Every morning | ORAL | Status: DC

## 2014-03-10 MED ORDER — CLARITHROMYCIN 500 MG PO TABS: 500.0000 mg | ORAL_TABLET | Freq: Two times a day (BID) | ORAL | Status: DC

## 2014-03-10 NOTE — Progress Notes (Signed)
   Subjective:    Patient ID: [name], female    DOB: Jul 01, 1966, 48 y.o.   MRN: [id number]  HPI Patient is here today to get a refill on her meds.  She also c/o: metallic taste in her mouth, and has metallic tast e in the mount  Neck/jaw pain  Numbness and white fingertips  These symptoms started about 2 months ago   Pale and red and tingly , off and on for five months  Left sided tender node, feels like the chan is tender and sore  Neck was hurting both side of the neck and significant, went to the chiro, didn't help much,  Had no meloxicam ran out  Not exercising regularly  Sore throat and clinching jaw feels tight at t  Hx of difficulties,   Patient states overall her ADHD is under decent control.  Experiences occasional migraine headaches but not very often at this time.  Not exercising regularly these days. Review of Systems No chest pain no headache no back pain no abdominal pain no change in bowel habits no blood in stool    Objective:   Physical Exam Alert no apparent distress mild malaise vital stable blood pressure good on repeat HEENT somewhat tender left anterior cervical node minimally enlarged. Right posterior lateral neck tender both sides tender with rotation. Distal arm strength sensation intact. Peripheral pulses and hands intact.  Pictures of the hands reviewed. With distinct erythema of the hands and blanching of distal fingers       Assessment & Plan:  Impression 1 raynaud's phenomenon versus renounced disease. Discussed #2 migraine headaches clinically stable. #3 ADHD decent control. #4 chronic neck strain discussed seeing chiropractor has not had anti-inflammatory. #5 lymphadenitis. Plan trial of Biaxin twice a day for 10 days. Motrin when necessary. Exercise encourage. Chronic medicines refilled. Appropriate blood work. 35-40 minutes spent most in discussion. WSL

## 2014-03-13 DIAGNOSIS — I7300 Raynaud's syndrome without gangrene: Secondary | ICD-10-CM | POA: Insufficient documentation

## 2014-03-14 ENCOUNTER — Encounter: Payer: Self-pay | Admitting: Family Medicine

## 2014-03-14 LAB — ANA: ANA: NEGATIVE

## 2014-03-14 LAB — RHEUMATOID FACTOR

## 2014-03-14 LAB — SEDIMENTATION RATE: Sed Rate: 1 mm/hr (ref 0–22)

## 2014-03-22 ENCOUNTER — Ambulatory Visit (INDEPENDENT_AMBULATORY_CARE_PROVIDER_SITE_OTHER): Payer: BC Managed Care – PPO | Admitting: Family Medicine

## 2014-03-22 ENCOUNTER — Encounter: Payer: Self-pay | Admitting: Family Medicine

## 2014-03-22 VITALS — BP 122/88 | Ht 63.0 in | Wt 156.0 lb

## 2014-03-22 DIAGNOSIS — R439 Unspecified disturbances of smell and taste: Secondary | ICD-10-CM

## 2014-03-22 DIAGNOSIS — J209 Acute bronchitis, unspecified: Secondary | ICD-10-CM

## 2014-03-22 DIAGNOSIS — J069 Acute upper respiratory infection, unspecified: Secondary | ICD-10-CM

## 2014-03-22 DIAGNOSIS — R438 Other disturbances of smell and taste: Secondary | ICD-10-CM

## 2014-03-22 MED ORDER — ALBUTEROL SULFATE HFA 108 (90 BASE) MCG/ACT IN AERS: 2.0000 | INHALATION_SPRAY | Freq: Four times a day (QID) | RESPIRATORY_TRACT | Status: DC | PRN

## 2014-03-22 MED ORDER — MELOXICAM 15 MG PO TABS: 15.0000 mg | ORAL_TABLET | Freq: Every day | ORAL | Status: DC

## 2014-03-22 MED ORDER — LEVOFLOXACIN 500 MG PO TABS: 500.0000 mg | ORAL_TABLET | Freq: Every day | ORAL | Status: DC

## 2014-03-22 NOTE — Progress Notes (Signed)
   Subjective:    Patient ID: [name], female    DOB: 1966/11/16, 48 y.o.   MRN: [id number]  Cough This is a new problem. The current episode started in the past 7 days. Associated symptoms include chest pain, chills, a fever, headaches and myalgias. Associated symptoms comments: Vomiting, diarrhea, jaw pain, ear pain. Treatments tried: ibuprofen, mucinex. The treatment provided mild relief.   Requesting refill on mobic for neck pain. Med was not sent in at last visit.  Felt bad Sunday and body aches on Tuesday with chest symptoms Deep breath made her cough Tried mucinex C/o bodyaches all over yesterday Highest 99.8 (pt states rare to have fever) Energy low over past 5 days No rashes, no tick bite  No childhood asthma but did have inhaler a few years ago for possible exercise asthma  No smoke exposure  Review of Systems  Constitutional: Positive for fever and chills.  Respiratory: Positive for cough.   Cardiovascular: Positive for chest pain.  Musculoskeletal: Positive for myalgias.  Neurological: Positive for headaches.       Objective:   Physical Exam  Neck no masses eardrums normal throat is normal lungs clear but at the cough is noted not rest for distress heart regular      Assessment & Plan:  Bronchial infection-I. feel she start off with an upper respiratory virus then started moving into her chest causing significant chest tightness along with congestion I would recommend albuterol on a regular basis plus antibiotics. I don't recommend CBC or chest x-ray currently. If she starts having high fevers difficulty breathing or worse followup  She will also keep track of her other symptoms she is having if she keeps having ongoing troubles she needs to discuss these further with Dr. Brett CanalesSteve to see if further testing or possibly subspecialty consultation is indicated. Recent lab work looked good patient was told this.

## 2014-03-28 ENCOUNTER — Ambulatory Visit (INDEPENDENT_AMBULATORY_CARE_PROVIDER_SITE_OTHER): Payer: BC Managed Care – PPO | Admitting: Family Medicine

## 2014-03-28 ENCOUNTER — Encounter: Payer: Self-pay | Admitting: Family Medicine

## 2014-03-28 VITALS — BP 122/78 | Ht 63.0 in | Wt 158.4 lb

## 2014-03-28 DIAGNOSIS — G43909 Migraine, unspecified, not intractable, without status migrainosus: Secondary | ICD-10-CM

## 2014-03-28 DIAGNOSIS — K219 Gastro-esophageal reflux disease without esophagitis: Secondary | ICD-10-CM

## 2014-03-28 DIAGNOSIS — I7300 Raynaud's syndrome without gangrene: Secondary | ICD-10-CM

## 2014-03-28 DIAGNOSIS — F909 Attention-deficit hyperactivity disorder, unspecified type: Secondary | ICD-10-CM

## 2014-03-28 MED ORDER — LEVOFLOXACIN 500 MG PO TABS: 500.0000 mg | ORAL_TABLET | Freq: Every day | ORAL | Status: DC

## 2014-03-28 NOTE — Progress Notes (Signed)
   Subjective:    Patient ID: [name], female    DOB: Jul 13, 1966, 48 y.o.   MRN: [id number]  HPI Patient arrives for a follow up from last visit and to discuss results of recent labs. Patient states things are going ok at this time.  Trigger for blanching can occur usually in the cold, First happened  No sig prob currently with the cdtn   reac airways with bronchial congestion. Notes occasional wheezing. Bronchial cough. Cough productive at times. See prior telephone messages. Now on Levaquin. Seems to be slowly improving.  Claims no major change and ADHD. Medications still helps. No obvious side effects.  Overall reflux is clinically stable.  Patient reports a different time of headache lately. Sudden. Sharp. Lancinating in nature. Often accompanied by tingling in the scalp Primarily on the right side of the head. Review of Systems No chest pain no back pain no abdominal pain no change in bowel habits no blood in stool ROS otherwise negative    Objective:   Physical Exam  Alert hydration good. HEENT currently normal. Neck supple lungs clear except for mild crackles left base. Heart regular rate and rhythm. Hands arterial pulses good. Artery refill perfect. Sensation intact.      Assessment & Plan:  Impression 1 raynaud's disease discussed blood work all returned negative. #2 neuropathic headaches discussed.  New type of headache for this patient on top of her baseline migraine. #3 persistent bronchial infection post viral infection with left basilar crackles plan stretchout Levaquin. Hold off on calcium channel blockers at this time rationale discussed. Nature of neuropathic headaches discussed. Followup as scheduled. WSL

## 2014-03-28 NOTE — Patient Instructions (Signed)
Neuropathic headaches to add to the chronic migraine d

## 2014-06-09 ENCOUNTER — Other Ambulatory Visit: Payer: Self-pay | Admitting: Family Medicine

## 2014-08-09 ENCOUNTER — Encounter: Payer: Self-pay | Admitting: Family Medicine

## 2014-08-09 ENCOUNTER — Ambulatory Visit (INDEPENDENT_AMBULATORY_CARE_PROVIDER_SITE_OTHER): Payer: BC Managed Care – PPO | Admitting: Family Medicine

## 2014-08-09 VITALS — BP 132/84 | Ht 63.0 in | Wt 160.0 lb

## 2014-08-09 DIAGNOSIS — F900 Attention-deficit hyperactivity disorder, predominantly inattentive type: Secondary | ICD-10-CM

## 2014-08-09 DIAGNOSIS — F909 Attention-deficit hyperactivity disorder, unspecified type: Secondary | ICD-10-CM

## 2014-08-09 DIAGNOSIS — Z23 Encounter for immunization: Secondary | ICD-10-CM

## 2014-08-09 DIAGNOSIS — I7300 Raynaud's syndrome without gangrene: Secondary | ICD-10-CM

## 2014-08-09 DIAGNOSIS — F3289 Other specified depressive episodes: Secondary | ICD-10-CM

## 2014-08-09 DIAGNOSIS — F329 Major depressive disorder, single episode, unspecified: Secondary | ICD-10-CM

## 2014-08-09 DIAGNOSIS — G43819 Other migraine, intractable, without status migrainosus: Secondary | ICD-10-CM

## 2014-08-09 DIAGNOSIS — K219 Gastro-esophageal reflux disease without esophagitis: Secondary | ICD-10-CM

## 2014-08-09 DIAGNOSIS — F32A Depression, unspecified: Secondary | ICD-10-CM

## 2014-08-09 MED ORDER — AMPHETAMINE-DEXTROAMPHET ER 20 MG PO CP24: 40.0000 mg | ORAL_CAPSULE | Freq: Every morning | ORAL | Status: DC

## 2014-08-09 MED ORDER — BUPROPION HCL ER (XL) 300 MG PO TB24: ORAL_TABLET | ORAL | Status: DC

## 2014-08-09 MED ORDER — OMEPRAZOLE 20 MG PO CPDR: 20.0000 mg | DELAYED_RELEASE_CAPSULE | Freq: Two times a day (BID) | ORAL | Status: DC

## 2014-08-09 MED ORDER — RIZATRIPTAN BENZOATE 10 MG PO TABS: ORAL_TABLET | ORAL | Status: DC

## 2014-08-09 NOTE — Progress Notes (Signed)
   Subjective:    Patient ID: [name], female    DOB: [birth year], 48 y.o.   MRN: [id number]  HPIADD check up. Takes med every day. No problems with meds.  Requesting refill on maxalt. Has been having at least one migraine every week for the past month. Also uses ibuprofen for headaches. Increased numbers lately.   Sneezing, watery eyes, scratchy throat, ears stopped up. Takes zyrtec but no relief. Allergy acting up, outdside a lot, and aroma kicked in allergy ,  Allergy definietely acting up  adha focuing overall is good, def helps when she take s it. No obvious side effects from the medicine. Mostly compliance.  Toenail excision dev large to separaton and pain  migr feel like the real migr approx once per wk    Reflux overall stable.  Patient reports depression is also stable.  Refill on omeprazole and wellbutrin.  Review of Systems No headache no chest pain no back pain no abdominal pain no change in bowel habits no blood in stool no rash ROS otherwise negative.    Objective:   Physical Exam  Alert no apparent distress. H&T normal. Neuro intact. Lungs clear. Heart regular in rhythm HEENT slight nasal congestion l      Assessment & Plan:  Impression 1 allergic rhinitis discussed #2 migraine headaches worsening lately. #3 ADHD clinically stable #4 depression stable per patient. Plan maintain same medicines. Encouraged to try steroid nasal sprays again. Maxalt refilled increase. Symptomatic care discussed exercise encourage recheck in 4 months. WSL

## 2014-08-15 ENCOUNTER — Other Ambulatory Visit: Payer: Self-pay | Admitting: *Deleted

## 2014-08-15 ENCOUNTER — Telehealth: Payer: Self-pay | Admitting: Family Medicine

## 2014-08-15 MED ORDER — ONDANSETRON 4 MG PO TBDP: 4.0000 mg | ORAL_TABLET | Freq: Three times a day (TID) | ORAL | Status: DC | PRN

## 2014-08-15 NOTE — Telephone Encounter (Signed)
Pt states she was supposed to get some zofran for when she is having her Migraine, states this was discussed at her last visit 08/09/14  [facility]   Call pt when sent

## 2014-08-15 NOTE — Telephone Encounter (Signed)
Med sent to pharm. Pt notified on voicemail.  

## 2014-08-15 NOTE — Telephone Encounter (Signed)
Zofr  oct 24 one sl prn three ref

## 2014-09-20 ENCOUNTER — Ambulatory Visit (INDEPENDENT_AMBULATORY_CARE_PROVIDER_SITE_OTHER): Payer: BC Managed Care – PPO | Admitting: Family Medicine

## 2014-09-20 ENCOUNTER — Encounter: Payer: Self-pay | Admitting: Family Medicine

## 2014-09-20 VITALS — BP 120/78 | Temp 98.3°F | Ht 63.0 in | Wt 164.5 lb

## 2014-09-20 DIAGNOSIS — H9202 Otalgia, left ear: Secondary | ICD-10-CM

## 2014-09-20 DIAGNOSIS — R519 Headache, unspecified: Secondary | ICD-10-CM

## 2014-09-20 DIAGNOSIS — R51 Headache: Secondary | ICD-10-CM

## 2014-09-20 MED ORDER — VALACYCLOVIR HCL 1 G PO TABS: 1000.0000 mg | ORAL_TABLET | Freq: Three times a day (TID) | ORAL | Status: DC

## 2014-09-20 NOTE — Progress Notes (Signed)
   Subjective:    Patient ID: [name], female    DOB: Apr 27, 1966, 48 y.o.   MRN: [id number]  Otalgia  There is pain in the left ear. This is a new problem. The current episode started yesterday. The problem has been unchanged. There has been no fever. The pain is at a severity of 4/10. The pain is moderate. Associated symptoms include headaches. She has tried NSAIDs for the symptoms. The treatment provided no relief.   Patient states that she has no other concerns at this time.   Burning in the ear Increased pain in ear with the ear Burning doind a little better No rash No bad pain with chewing today but did yesterday   Review of Systems  HENT: Positive for ear pain.   Neurological: Positive for headaches.   she relates ear pain sensitive skin on the left side of her head especially in the side area she denies any loss of vision.     Objective:   Physical Exam There is no tenderness in the temporal region Lungs clear heart regular neck no masses eardrums are normal skin has no lesions and throat is normal no adenopathy is felt      Assessment & Plan:  Left otalgia with left side head pain this is suspicious for the possibility of early shingles I think it is wise to give her a prescription of Valtrex to use should shingles breakout. She is to give us a follow-up phone call 48 hours. If getting progressively worse she will need referral to possibly ENT.

## 2014-09-22 ENCOUNTER — Other Ambulatory Visit: Payer: Self-pay | Admitting: *Deleted

## 2014-09-22 ENCOUNTER — Telehealth: Payer: Self-pay | Admitting: *Deleted

## 2014-09-22 MED ORDER — CEFPROZIL 500 MG PO TABS: 500.0000 mg | ORAL_TABLET | Freq: Two times a day (BID) | ORAL | Status: DC

## 2014-09-22 NOTE — Telephone Encounter (Signed)
Discussed with patient. Med [facility] to [facility]

## 2014-09-22 NOTE — Telephone Encounter (Signed)
At this point I would not use Valtrex unless there is actuall lesions. With a tender lymph node it is possible for other infection. Cefzil 500, one bid 7 days, if not getting back to normal by next week then ENT referral rec so call us T or Wed if not well

## 2014-09-22 NOTE — Telephone Encounter (Signed)
Seen 10/28. Was asked to call back today with an update. Pt states she is taking ibuprofen every 6- 8 hours for pain. When med wears off she starts to have a dull ear ache and headache on left side. Lymph node still tender. Pain is better than is was when seen. No rash. Was prescribed valtrex but has not started because she was told to only start if she broke out in rash. Pt does not feel like she has had any fever but she is taking ibuprofen every 6 - 8 hours.

## 2014-12-20 ENCOUNTER — Telehealth: Payer: Self-pay | Admitting: Family Medicine

## 2014-12-20 ENCOUNTER — Other Ambulatory Visit: Payer: Self-pay | Admitting: *Deleted

## 2014-12-20 MED ORDER — AMPHETAMINE-DEXTROAMPHET ER 20 MG PO CP24: 40.0000 mg | ORAL_CAPSULE | Freq: Every morning | ORAL | Status: DC

## 2014-12-20 NOTE — Telephone Encounter (Signed)
Pt is needing a refill on her adderall.  

## 2014-12-20 NOTE — Telephone Encounter (Signed)
Ok times one, ov next mo

## 2014-12-20 NOTE — Telephone Encounter (Signed)
Last ADD check up 08/09/14

## 2014-12-20 NOTE — Telephone Encounter (Signed)
Script ready for pickup. Pt notified on voicemail and also that she needs ov before further refills.

## 2015-01-02 ENCOUNTER — Ambulatory Visit: Payer: BC Managed Care – PPO | Admitting: Family Medicine

## 2015-01-02 ENCOUNTER — Encounter: Payer: Self-pay | Admitting: Family Medicine

## 2015-01-02 ENCOUNTER — Ambulatory Visit (INDEPENDENT_AMBULATORY_CARE_PROVIDER_SITE_OTHER): Payer: BC Managed Care – PPO | Admitting: Family Medicine

## 2015-01-02 VITALS — BP 130/80 | Ht 63.0 in | Wt 163.5 lb

## 2015-01-02 DIAGNOSIS — G43819 Other migraine, intractable, without status migrainosus: Secondary | ICD-10-CM

## 2015-01-02 DIAGNOSIS — Z13220 Encounter for screening for lipoid disorders: Secondary | ICD-10-CM

## 2015-01-02 DIAGNOSIS — K219 Gastro-esophageal reflux disease without esophagitis: Secondary | ICD-10-CM

## 2015-01-02 DIAGNOSIS — R635 Abnormal weight gain: Secondary | ICD-10-CM

## 2015-01-02 DIAGNOSIS — Z79899 Other long term (current) drug therapy: Secondary | ICD-10-CM

## 2015-01-02 DIAGNOSIS — R7302 Impaired glucose tolerance (oral): Secondary | ICD-10-CM

## 2015-01-02 DIAGNOSIS — M2242 Chondromalacia patellae, left knee: Secondary | ICD-10-CM

## 2015-01-02 DIAGNOSIS — F900 Attention-deficit hyperactivity disorder, predominantly inattentive type: Secondary | ICD-10-CM

## 2015-01-02 MED ORDER — AMPHETAMINE-DEXTROAMPHET ER 20 MG PO CP24: 40.0000 mg | ORAL_CAPSULE | Freq: Every morning | ORAL | Status: DC

## 2015-01-02 NOTE — Progress Notes (Signed)
   Subjective:    Patient ID: [name], female    DOB: 13-Aug-1966, 49 y.o.   MRN: [id number]  HPI Patient was seen today for ADD checkup. -weight, vital signs reviewed.  The following items were covered. -Compliance with medication : yes  -Problems with attention: none  - Eating patterns : good  -sleeping:good  -Additional issues or questions: Patient states that she has been having left knee pain that has been present for about 2 months now. Patient has been seeing a chiropractor for this. achey and started slow, uncomfortable,hx chondromalacia hx,    No remote hx of knee inj  Pos hx of chondromalacia patella  meds still definitely helping  One meds   Exercises a fair amnt--likes to dance  Gets migraines on occas, Allergy type headaches today  wellbutrinxl handkling well, doable, nothing major  Tsh, lipid , glu and A1c   Review of Systems No headache no chest pain no back pain no abdominal pain    Objective:   Physical Exam  Alert moderate malaise HEENT normal thyroid nonpalpable neck supple lungs clear heart rare rhythm left knee crepitations evident with extension and flexion.      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 Appropriate blood work. Diet exercise discuss and encourage. At one point Maintain need orthopedic referral discussed WSL

## 2015-01-03 ENCOUNTER — Other Ambulatory Visit: Payer: Self-pay

## 2015-01-05 LAB — CYTOLOGY - PAP

## 2015-01-07 LAB — LIPID PANEL
CHOL/HDL RATIO: 2.7 ratio
CHOLESTEROL: 195 mg/dL (ref 0–200)
HDL: 72 mg/dL (ref >39)
LDL CALC: 113 mg/dL — AB (ref 0–99)
TRIGLYCERIDES: 51 mg/dL (ref <150)
VLDL: 10 mg/dL (ref 0–40)

## 2015-01-07 LAB — BASIC METABOLIC PANEL
BUN: 14 mg/dL (ref 6–23)
CO2: 27 meq/L (ref 19–32)
Calcium: 9.4 mg/dL (ref 8.4–10.5)
Chloride: 105 mEq/L (ref 96–112)
Creat: 0.85 mg/dL (ref 0.50–1.10)
Glucose, Bld: 84 mg/dL (ref 70–99)
Potassium: 4.6 mEq/L (ref 3.5–5.3)
Sodium: 141 mEq/L (ref 135–145)

## 2015-01-07 LAB — HEPATIC FUNCTION PANEL
ALBUMIN: 4.1 g/dL (ref 3.5–5.2)
ALT: 11 U/L (ref 0–35)
AST: 14 U/L (ref 0–37)
Alkaline Phosphatase: 50 U/L (ref 39–117)
Bilirubin, Direct: 0.1 mg/dL (ref 0.0–0.3)
Indirect Bilirubin: 0.5 mg/dL (ref 0.2–1.2)
Total Bilirubin: 0.6 mg/dL (ref 0.2–1.2)
Total Protein: 6.3 g/dL (ref 6.0–8.3)

## 2015-01-07 LAB — HEMOGLOBIN A1C
HEMOGLOBIN A1C: 5.2 % (ref <5.7)
Mean Plasma Glucose: 103 mg/dL (ref <117)

## 2015-01-07 LAB — TSH: TSH: 1.567 u[IU]/mL (ref 0.350–4.500)

## 2015-01-08 ENCOUNTER — Encounter: Payer: Self-pay | Admitting: Family Medicine

## 2015-02-16 ENCOUNTER — Other Ambulatory Visit: Payer: Self-pay | Admitting: Family Medicine

## 2015-02-27 ENCOUNTER — Telehealth: Payer: Self-pay | Admitting: Family Medicine

## 2015-02-27 NOTE — Telephone Encounter (Signed)
Rx prior auth APPROVED for pt's amphetamine-dextroamphetamine (ADDERALL XR) 20 MG 24 hr capsule, Case# [id number] with [employer], expires 02/27/16

## 2015-05-15 ENCOUNTER — Telehealth: Payer: Self-pay | Admitting: Family Medicine

## 2015-05-15 ENCOUNTER — Other Ambulatory Visit: Payer: Self-pay | Admitting: Family Medicine

## 2015-05-15 MED ORDER — AMPHETAMINE-DEXTROAMPHET ER 20 MG PO CP24
40.0000 mg | ORAL_CAPSULE | Freq: Every morning | ORAL | Status: DC
Start: 2015-05-15 — End: 2015-06-25

## 2015-05-15 MED ORDER — BUPROPION HCL ER (XL) 300 MG PO TB24: 300.0000 mg | ORAL_TABLET | Freq: Every day | ORAL | Status: DC

## 2015-05-15 NOTE — Telephone Encounter (Signed)
Last seen 01/02/15

## 2015-05-15 NOTE — Telephone Encounter (Signed)
Spoke with patient and informed her that her requested prescriptions for Adderall and wellbutrin have been filled for 30 day supply only and that the Adderall prescription hard copy is ready for pick up. Also informed patient that office visit is needed. Patient verbalized understanding.

## 2015-05-15 NOTE — Telephone Encounter (Signed)
Pt called requesting refills on her adderall and welbutrin

## 2015-05-15 NOTE — Telephone Encounter (Signed)
30 d only neds o v

## 2015-06-25 ENCOUNTER — Ambulatory Visit (INDEPENDENT_AMBULATORY_CARE_PROVIDER_SITE_OTHER): Payer: BC Managed Care – PPO | Admitting: Nurse Practitioner

## 2015-06-25 ENCOUNTER — Encounter: Payer: Self-pay | Admitting: Nurse Practitioner

## 2015-06-25 VITALS — BP 122/80 | Temp 97.9°F | Ht 63.0 in | Wt 161.2 lb

## 2015-06-25 DIAGNOSIS — J029 Acute pharyngitis, unspecified: Secondary | ICD-10-CM | POA: Diagnosis not present

## 2015-06-25 DIAGNOSIS — F900 Attention-deficit hyperactivity disorder, predominantly inattentive type: Secondary | ICD-10-CM

## 2015-06-25 MED ORDER — AMPHETAMINE-DEXTROAMPHET ER 20 MG PO CP24: 40.0000 mg | ORAL_CAPSULE | Freq: Every morning | ORAL | Status: DC

## 2015-06-25 MED ORDER — AZITHROMYCIN 250 MG PO TABS: ORAL_TABLET | ORAL | Status: DC

## 2015-06-26 ENCOUNTER — Encounter: Payer: Self-pay | Admitting: Nurse Practitioner

## 2015-06-26 NOTE — Progress Notes (Signed)
Subjective:  Presents for recheck on her ADHD. Symptoms well controlled with Adderall XR 20 mg 2 per day. Focusing well. Completing tasks. Denies any adverse effects. Also complaints of throat symptoms that began 3 days ago. States her throat is felt swollen and dry. No fever. Mild dull headache. Sore throat mainly on the right side. Symptoms were worse 2 days ago, had improved. At that time patient saw some white pockets in the back of her throat which have resolved. No cough runny nose or ear pain. No wheezing. No difficulty swallowing. Taking fluids well.  Objective:   BP 122/80 mmHg  Temp(Src) 97.9 F (36.6 C) (Oral)  Ht  (1.6 m)  Wt 161 lb 4 oz (73.143 kg)  BMI 28.57 kg/m2 NAD. Alert, oriented. TMs significant cloudy effusion on the right ear, minimal on the left. Pharynx minimal erythema, green PND noted. One small clear vesicles noted on each side of the posterior soft palate. No other lesions are noted. Neck supple with moderate anterior cervical adenopathy very tender on the right side. No posterior adenopathy noted. Lungs clear. Heart regular rate rhythm.  Assessment:  Problem List Items Addressed This Visit      Other   ADHD (attention deficit hyperactivity disorder) - Primary    Other Visit Diagnoses    Acute pharyngitis, unspecified pharyngitis type          Plan:  Meds ordered this encounter  Medications  . azithromycin (ZITHROMAX Z-PAK) 250 MG tablet    Sig: Take 2 tablets (500 mg) on  Day 1,  followed by 1 tablet (250 mg) once daily on Days 2 through 5.    Dispense:  6 each    Refill:  0    Order Specific Question:  Supervising Provider    Answer:  Merlyn Albert [[id number]]  . DISCONTD: amphetamine-dextroamphetamine (ADDERALL XR) 20 MG 24 hr capsule    Sig: Take 2 capsules (40 mg total) by mouth every morning.    Dispense:  60 capsule    Refill:  0    Order Specific Question:  Supervising Provider    Answer:  Merlyn Albert [[id number]]  . DISCONTD:  amphetamine-dextroamphetamine (ADDERALL XR) 20 MG 24 hr capsule    Sig: Take 2 capsules (40 mg total) by mouth every morning.    Dispense:  60 capsule    Refill:  0    May fill 30 days from 06/25/15    Order Specific Question:  Supervising Provider    Answer:  Merlyn Albert [[id number]]  . DISCONTD: amphetamine-dextroamphetamine (ADDERALL XR) 20 MG 24 hr capsule    Sig: Take 2 capsules (40 mg total) by mouth every morning.    Dispense:  60 capsule    Refill:  0    May fill 60 days from 06/25/15    Order Specific Question:  Supervising Provider    Answer:  Merlyn Albert [[id number]]  . amphetamine-dextroamphetamine (ADDERALL XR) 20 MG 24 hr capsule    Sig: Take 2 capsules (40 mg total) by mouth every morning.    Dispense:  60 capsule    Refill:  0    May fill 90 days from 06/25/15   Z-Pak to cover pharyngitis and upper respiratory symptoms. OTC meds as directed. Call back by the end of the week if no improvement, sooner if worse. Given 3 monthly prescriptions for Adderall by NP and a fourth by M.D. Return in about 4 months (around 10/25/2015).

## 2015-06-27 ENCOUNTER — Other Ambulatory Visit: Payer: Self-pay | Admitting: Family Medicine

## 2015-06-27 NOTE — Telephone Encounter (Signed)
Ok for six mo total since just seen

## 2015-07-03 ENCOUNTER — Other Ambulatory Visit: Payer: Self-pay | Admitting: Family Medicine

## 2015-07-03 MED ORDER — OMEPRAZOLE 20 MG PO CPDR: 20.0000 mg | DELAYED_RELEASE_CAPSULE | Freq: Two times a day (BID) | ORAL | Status: DC

## 2015-07-03 NOTE — Telephone Encounter (Signed)
omeprazole (PRILOSEC) 20 MG capsule  Pt needs refill on this please   [facility]

## 2015-07-03 NOTE — Telephone Encounter (Signed)
Notified patient that medication has been sent to pharmacy. °

## 2015-07-10 ENCOUNTER — Telehealth: Payer: Self-pay | Admitting: Family Medicine

## 2015-07-10 MED ORDER — OMEPRAZOLE MAGNESIUM 20 MG PO TBEC: 20.0000 mg | DELAYED_RELEASE_TABLET | Freq: Two times a day (BID) | ORAL | Status: DC

## 2015-07-10 NOTE — Telephone Encounter (Signed)
Pt is needing a refill on her prilosec otc at two a day not the generic   [facility]

## 2015-07-10 NOTE — Telephone Encounter (Signed)
Med refills sent to pharmacy. Patient was notified.  

## 2015-10-08 ENCOUNTER — Other Ambulatory Visit: Payer: Self-pay | Admitting: Family Medicine

## 2015-10-17 ENCOUNTER — Other Ambulatory Visit: Payer: Self-pay | Admitting: Gastroenterology

## 2015-10-24 ENCOUNTER — Ambulatory Visit (INDEPENDENT_AMBULATORY_CARE_PROVIDER_SITE_OTHER): Payer: BC Managed Care – PPO | Admitting: Nurse Practitioner

## 2015-10-24 ENCOUNTER — Encounter: Payer: Self-pay | Admitting: Nurse Practitioner

## 2015-10-24 VITALS — BP 120/80 | Ht 63.0 in | Wt 159.2 lb

## 2015-10-24 DIAGNOSIS — F902 Attention-deficit hyperactivity disorder, combined type: Secondary | ICD-10-CM | POA: Diagnosis not present

## 2015-10-24 MED ORDER — AMPHETAMINE-DEXTROAMPHET ER 20 MG PO CP24: 40.0000 mg | ORAL_CAPSULE | Freq: Every morning | ORAL | Status: DC

## 2015-10-24 NOTE — Progress Notes (Signed)
Subjective:  Patient was seen today for ADD checkup. -weight, vital signs reviewed.  The following items were covered. -Compliance with medication : yes  -Problems with completing work: none  -grades: NA  - Eating patterns : normal  -sleeping:  some insomnia; no issues with meds  -Additional issues or questions: none today   Objective:   BP 120/80 mmHg  Ht 5\' 3"  (1.6 m)  Wt 159 lb 4 oz (72.235 kg)  BMI 28.22 kg/m2 NAD. Alert, oriented. Lungs clear. Heart regular rate rhythm.  Assessment:  Problem List Items Addressed This Visit      Other   ADHD (attention deficit hyperactivity disorder) - Primary     Plan:  Meds ordered this encounter  Medications  . DISCONTD: amphetamine-dextroamphetamine (ADDERALL XR) 20 MG 24 hr capsule    Sig: Take 2 capsules (40 mg total) by mouth every morning.    Dispense:  60 capsule    Refill:  0    Order Specific Question:  Supervising Provider    Answer:  Merlyn AlbertLUKING, WILLIAM S [[id number]]  . DISCONTD: amphetamine-dextroamphetamine (ADDERALL XR) 20 MG 24 hr capsule    Sig: Take 2 capsules (40 mg total) by mouth every morning.    Dispense:  60 capsule    Refill:  0    May fill 30 days from 10/24/15    Order Specific Question:  Supervising Provider    Answer:  Merlyn AlbertLUKING, WILLIAM S [[id number]]  . amphetamine-dextroamphetamine (ADDERALL XR) 20 MG 24 hr capsule    Sig: Take 2 capsules (40 mg total) by mouth every morning.    Dispense:  60 capsule    Refill:  0    May fill 60 days from 10/24/15    Order Specific Question:  Supervising Provider    Answer:  Merlyn AlbertLUKING, WILLIAM S [[id number]]   Patient to call near the end of her third prescription for one more until her visit again in 4 months. Note she has a small benign appearing skin tag in the right axillary area, may have this removed here or dermatology.

## 2015-10-26 ENCOUNTER — Encounter: Payer: Self-pay | Admitting: Nurse Practitioner

## 2015-11-15 ENCOUNTER — Ambulatory Visit (INDEPENDENT_AMBULATORY_CARE_PROVIDER_SITE_OTHER): Payer: BC Managed Care – PPO | Admitting: Family Medicine

## 2015-11-15 ENCOUNTER — Encounter: Payer: Self-pay | Admitting: Family Medicine

## 2015-11-15 VITALS — BP 132/90 | Temp 97.9°F | Wt 159.0 lb

## 2015-11-15 DIAGNOSIS — J329 Chronic sinusitis, unspecified: Secondary | ICD-10-CM

## 2015-11-15 MED ORDER — MECLIZINE HCL 25 MG PO TABS: 25.0000 mg | ORAL_TABLET | Freq: Three times a day (TID) | ORAL | Status: DC | PRN

## 2015-11-15 MED ORDER — AMOXICILLIN-POT CLAVULANATE 875-125 MG PO TABS: 1.0000 | ORAL_TABLET | Freq: Two times a day (BID) | ORAL | Status: AC

## 2015-11-15 NOTE — Progress Notes (Signed)
   Subjective:    Patient ID: [name], female    DOB: [birth year], 49 y.o.   MRN: [id number]  Sinusitis This is a new problem. Episode onset: 2 weeks. Associated symptoms include coughing, ear pain and headaches. (Runny nose, dizziness) Treatments tried: ibuprofen, pseudophedrine, antihistimine.    Worse with movement  congestionn  No major runny nose or cough or gunkingess  Some incr of the headache with positional change, felt lightheaded, spinnning , felt cdrunk,  Had vertigo,  Diminished energy overall low-grade fever  Review of Systems  HENT: Positive for ear pain.   Respiratory: Positive for cough.   Neurological: Positive for headaches.       Objective:   Physical Exam Alert mild malaise. HEENT TMs retracted old scarring noted frontal maxillary fullness pharynx normal neck supple lungs clear heart regular rate and rhythm.       Assessment & Plan:  Impression subacute rhinosinusitis with vertigo elements discussed plan meclizine when necessary Augmentin twice a day 10 days. Symptomatic care discussed warning signs discussed WSL

## 2015-12-04 ENCOUNTER — Other Ambulatory Visit: Payer: Self-pay | Admitting: Family Medicine

## 2015-12-04 NOTE — Telephone Encounter (Signed)
Dr. Steeves 

## 2015-12-05 ENCOUNTER — Encounter: Payer: Self-pay | Admitting: Family Medicine

## 2015-12-05 ENCOUNTER — Ambulatory Visit (INDEPENDENT_AMBULATORY_CARE_PROVIDER_SITE_OTHER): Payer: BC Managed Care – PPO | Admitting: Family Medicine

## 2015-12-05 VITALS — BP 130/80 | Temp 97.5°F | Ht 63.0 in | Wt 161.0 lb

## 2015-12-05 DIAGNOSIS — N390 Urinary tract infection, site not specified: Secondary | ICD-10-CM | POA: Diagnosis not present

## 2015-12-05 DIAGNOSIS — R319 Hematuria, unspecified: Secondary | ICD-10-CM

## 2015-12-05 DIAGNOSIS — R300 Dysuria: Secondary | ICD-10-CM | POA: Diagnosis not present

## 2015-12-05 LAB — POCT URINALYSIS DIPSTICK
PH UA: 6
Spec Grav, UA: 1.015

## 2015-12-05 MED ORDER — CIPROFLOXACIN HCL 500 MG PO TABS: 500.0000 mg | ORAL_TABLET | Freq: Two times a day (BID) | ORAL | Status: AC

## 2015-12-05 NOTE — Progress Notes (Signed)
   Subjective:    Patient ID: [name], female    DOB: 01-18-1966, 50 y.o.   MRN: [id number]  Dysuria  This is a new problem. The current episode started in the past 7 days. Associated symptoms include frequency and hematuria. Associated symptoms comments: pain. She has tried increased fluids for the symptoms.   Results for orders placed or performed in visit on 12/05/15  POCT urinalysis dipstick  Result Value Ref Range   Color, UA     Clarity, UA     Glucose, UA     Bilirubin, UA     Ketones, UA     Spec Grav, UA 1.015    Blood, UA large    pH, UA 6.0    Protein, UA     Urobilinogen, UA     Nitrite, UA     Leukocytes, UA small (1+) (A) Negative    patient is had some low back discomfort feeling worse. Positive dysuria positive increase frequency   Review of Systems  Genitourinary: Positive for dysuria, frequency and hematuria.    no nausea no vomiting no chills    Objective:   Physical Exam   alert vitals stable lungs clear. Heart regular in rhythm plus minus right CVA tenderness   urine impressive white blood cells red blood cells    Assessment & Plan:   impression urinary tract infection question element of upper urinary tract involvement discussed plan Cipro twice a day 500 mg culture urine rationale discussed with recent other antibiotics

## 2015-12-07 LAB — URINE CULTURE

## 2016-01-05 ENCOUNTER — Other Ambulatory Visit: Payer: Self-pay | Admitting: Family Medicine

## 2016-01-18 ENCOUNTER — Ambulatory Visit (INDEPENDENT_AMBULATORY_CARE_PROVIDER_SITE_OTHER): Payer: BC Managed Care – PPO | Admitting: Family Medicine

## 2016-01-18 ENCOUNTER — Encounter: Payer: Self-pay | Admitting: Family Medicine

## 2016-01-18 VITALS — BP 122/84 | Temp 98.1°F | Ht 63.0 in | Wt 158.5 lb

## 2016-01-18 DIAGNOSIS — G43009 Migraine without aura, not intractable, without status migrainosus: Secondary | ICD-10-CM | POA: Diagnosis not present

## 2016-01-18 MED ORDER — TRAMADOL HCL 50 MG PO TABS: ORAL_TABLET | ORAL | Status: DC

## 2016-01-18 MED ORDER — BACLOFEN 10 MG PO TABS: 10.0000 mg | ORAL_TABLET | Freq: Three times a day (TID) | ORAL | Status: DC

## 2016-01-18 MED ORDER — PREDNISONE 10 MG PO TABS: ORAL_TABLET | ORAL | Status: DC

## 2016-01-18 NOTE — Progress Notes (Signed)
   Subjective:    Patient ID: [name], female    DOB: Mar 03, 1966, 50 y.o.   MRN: [id number]  Migraine  This is a new problem. The current episode started 1 to 4 weeks ago. The problem occurs constantly. The problem has been gradually worsening. The quality of the pain is described as squeezing, throbbing and sharp. Associated symptoms include ear pain, photophobia and scalp tenderness. Associated symptoms comments: Twitching. Treatments tried: Maxalt, Ibuprofen, Zofran, Baclofen.   Patient has long-standing history of migraine headaches. Was unable to tolerate Topamax. Saw headache specialist for a long time. In recent years is been controlled reasonably well on current regimen. Patient notes experiencing classic migraines along with steady posterior deep ache  Claims no increased stress.e off-and-on severe this week Lateral cervical region extending to posterior skull  Nystagmus like symptoms  No certain reason why more migr than usualincr with changes in weather and other stimuli no stress  Pos vertigo, with some tremor  Some phonophobia  headach Review of Systems  HENT: Positive for ear pain.   Eyes: Positive for photophobia.       Objective:   Physical Exam  Alert anxious appearing vital stable HEENT normal lungs clear heart regular rhythm negative cerebellar findings no focal neurological deficit no tremor good musculoskeletal strength and tone posterior lateral cervical tenderness      Assessment & Plan:  Impression progressive worsening of chronic migraine headaches won't with an element of tension headaches. Discussed at length. Many preventive features have not worked in the past plan prednisone taper. Try to avoid over-the-counter analgesics. Add tramadol when necessary. Use Zofran when necessary. If headaches persist we can set up with a headache specialist patient to call us back. 25 minutes spent most in discussion. No indication for imaging at this time  discussed also. Baclofen also refill this is been helpful in the past particularly for muscle tension component

## 2016-02-06 ENCOUNTER — Encounter: Payer: Self-pay | Admitting: Nurse Practitioner

## 2016-02-08 ENCOUNTER — Other Ambulatory Visit: Payer: Self-pay | Admitting: Nurse Practitioner

## 2016-02-08 MED ORDER — AMPHETAMINE-DEXTROAMPHET ER 20 MG PO CP24: 40.0000 mg | ORAL_CAPSULE | Freq: Every morning | ORAL | Status: DC

## 2016-02-09 ENCOUNTER — Other Ambulatory Visit: Payer: Self-pay | Admitting: Family Medicine

## 2016-02-15 ENCOUNTER — Encounter: Payer: BC Managed Care – PPO | Admitting: Nurse Practitioner

## 2016-03-13 ENCOUNTER — Other Ambulatory Visit: Payer: Self-pay | Admitting: Family Medicine

## 2016-03-13 NOTE — Telephone Encounter (Signed)
Ok six mo worth 

## 2016-04-07 ENCOUNTER — Ambulatory Visit (INDEPENDENT_AMBULATORY_CARE_PROVIDER_SITE_OTHER): Payer: BC Managed Care – PPO | Admitting: Family Medicine

## 2016-04-07 ENCOUNTER — Encounter: Payer: Self-pay | Admitting: Family Medicine

## 2016-04-07 VITALS — BP 112/78 | Ht 63.0 in | Wt 166.4 lb

## 2016-04-07 DIAGNOSIS — F902 Attention-deficit hyperactivity disorder, combined type: Secondary | ICD-10-CM | POA: Diagnosis not present

## 2016-04-07 DIAGNOSIS — F329 Major depressive disorder, single episode, unspecified: Secondary | ICD-10-CM

## 2016-04-07 DIAGNOSIS — J329 Chronic sinusitis, unspecified: Secondary | ICD-10-CM | POA: Diagnosis not present

## 2016-04-07 DIAGNOSIS — G43009 Migraine without aura, not intractable, without status migrainosus: Secondary | ICD-10-CM | POA: Diagnosis not present

## 2016-04-07 DIAGNOSIS — F32A Depression, unspecified: Secondary | ICD-10-CM

## 2016-04-07 MED ORDER — AZITHROMYCIN 250 MG PO TABS: ORAL_TABLET | ORAL | Status: DC

## 2016-04-07 MED ORDER — AMPHETAMINE-DEXTROAMPHET ER 20 MG PO CP24: 40.0000 mg | ORAL_CAPSULE | Freq: Every morning | ORAL | Status: DC

## 2016-04-07 NOTE — Progress Notes (Signed)
   Subjective:    Patient ID: [name], female    DOB: 1966/06/03, 50 y.o.   MRN: [id number] Patient arrives office with several distinct concerns HPI Patient was seen today for ADD checkup.overall xr helping overall cuts down the dos one the weekend. Current dose is helping attention and focusing. No obvious side effects  Exercise is so so  wellbutrin states overall helps. No obvious side effects. Handling well. Takes regularly.     -weight, vital signs reviewed.  HA s overall bdtter has improved. meds working. Baclofen helping, using maxalt prn , stopped ibu hardly at all   The following items were covered. -Compliance with medication: yes  - Eating patterns: good  -sleeping: good  -Additional issues or questions: Patient states that she has a sore throat, drainage, headache, coughing. Onset 4 days ago. Marland Kitchen. Pos cong and dranage and sore throat, nonproductive pos gunky,, Throat very sore this noreb , addded chlorpherniramine helped minimally       Review of Systems No headache, no major weight loss or weight gain, no chest pain no back pain abdominal pain no change in bowel habits complete ROS otherwise negative     Objective:   Physical Exam Alert vitals stable. HET moderate his congestion frontal neck supple lungs rare rhonchi heart rare rhythm. Neuro exam intact no focal deficits       Assessment & Plan:  Impression 1 ADHD good control discussed maintain same meds #2 chronic depression clinically stable decent control maintain same meds #3 migraine headaches clinically improved see prior note also element tension headache #450 rhinosinusitis/bronchitis. Only day for illness. Plan antibiotics prescribed to use if persists. Other medications refilled. Diet exercise discussed recheck in 4 months WSL

## 2016-04-10 ENCOUNTER — Telehealth: Payer: Self-pay | Admitting: Family Medicine

## 2016-04-10 NOTE — Telephone Encounter (Signed)
Patient's Amphetamine-Dextroamphet ER was approved via patient's insurance from 04/10/2016-04/11/2019.

## 2016-08-13 ENCOUNTER — Other Ambulatory Visit: Payer: Self-pay | Admitting: Family Medicine

## 2016-08-13 NOTE — Telephone Encounter (Signed)
Ok plus 2 ref 

## 2016-08-22 ENCOUNTER — Ambulatory Visit: Payer: BC Managed Care – PPO | Admitting: Nurse Practitioner

## 2016-08-28 ENCOUNTER — Encounter: Payer: Self-pay | Admitting: Nurse Practitioner

## 2016-08-28 ENCOUNTER — Ambulatory Visit (INDEPENDENT_AMBULATORY_CARE_PROVIDER_SITE_OTHER): Payer: BC Managed Care – PPO | Admitting: Nurse Practitioner

## 2016-08-28 VITALS — BP 124/80 | Ht 63.0 in | Wt 167.4 lb

## 2016-08-28 DIAGNOSIS — F902 Attention-deficit hyperactivity disorder, combined type: Secondary | ICD-10-CM | POA: Diagnosis not present

## 2016-08-28 DIAGNOSIS — K219 Gastro-esophageal reflux disease without esophagitis: Secondary | ICD-10-CM

## 2016-08-28 MED ORDER — RANITIDINE HCL 300 MG PO TABS: 300.0000 mg | ORAL_TABLET | Freq: Every day | ORAL | 1 refills | Status: DC

## 2016-08-28 MED ORDER — AMPHETAMINE-DEXTROAMPHET ER 20 MG PO CP24: 40.0000 mg | ORAL_CAPSULE | Freq: Every morning | ORAL | 0 refills | Status: DC

## 2016-08-28 MED ORDER — ALBUTEROL SULFATE HFA 108 (90 BASE) MCG/ACT IN AERS: 2.0000 | INHALATION_SPRAY | Freq: Four times a day (QID) | RESPIRATORY_TRACT | 2 refills | Status: DC | PRN

## 2016-08-28 MED ORDER — AMPHETAMINE-DEXTROAMPHET ER 20 MG PO CP24
40.0000 mg | ORAL_CAPSULE | Freq: Every morning | ORAL | 0 refills | Status: DC
Start: 2016-08-28 — End: 2016-08-28

## 2016-08-28 NOTE — Progress Notes (Signed)
Subjective : Patient was seen today for ADD checkup. -weight, vital signs reviewed.  The following items were covered. -Compliance with medication : yes  -Problems with completing work, paying attention: none  -grades: NA  - Eating patterns : no change  -sleeping: no problems  -Additional issues or questions: Zantac working well for reflux when she can remember to take second dose  Objective: NAD. Alert, oriented. Lungs clear. Heart RRR. Abdomen soft, non tender.   Assessment:  Problem List Items Addressed This Visit      Digestive   Esophageal reflux   Relevant Medications   ranitidine (ZANTAC) 300 MG tablet     Other   ADHD (attention deficit hyperactivity disorder) - Primary    Other Visit Diagnoses   None.    Plan:  Meds ordered this encounter  Medications  . DISCONTD: ranitidine (ZANTAC) 150 MG tablet    Sig: Take 150 mg by mouth 2 (two) times daily.  . ranitidine (ZANTAC) 300 MG tablet    Sig: Take 1 tablet (300 mg total) by mouth at bedtime.    Dispense:  90 tablet    Refill:  1    Order Specific Question:   Supervising Provider    Answer:   Merlyn AlbertLUKING, WILLIAM S [[id number]]  . albuterol (PROVENTIL HFA;VENTOLIN HFA) 108 (90 Base) MCG/ACT inhaler    Sig: Inhale 2 puffs into the lungs every 6 (six) hours as needed for wheezing.    Dispense:  1 Inhaler    Refill:  2    Order Specific Question:   Supervising Provider    Answer:   Merlyn AlbertLUKING, WILLIAM S [[id number]]  . DISCONTD: amphetamine-dextroamphetamine (ADDERALL XR) 20 MG 24 hr capsule    Sig: Take 2 capsules (40 mg total) by mouth every morning.    Dispense:  60 capsule    Refill:  0    Order Specific Question:   Supervising Provider    Answer:   Merlyn AlbertLUKING, WILLIAM S [[id number]]  . DISCONTD: amphetamine-dextroamphetamine (ADDERALL XR) 20 MG 24 hr capsule    Sig: Take 2 capsules (40 mg total) by mouth every morning.    Dispense:  60 capsule    Refill:  0    May fill 30 days from 08/28/16    Order Specific Question:    Supervising Provider    Answer:   Merlyn AlbertLUKING, WILLIAM S [[id number]]  . DISCONTD: amphetamine-dextroamphetamine (ADDERALL XR) 20 MG 24 hr capsule    Sig: Take 2 capsules (40 mg total) by mouth every morning.    Dispense:  60 capsule    Refill:  0    May fill 60 days from 08/28/16    Order Specific Question:   Supervising Provider    Answer:   Merlyn AlbertLUKING, WILLIAM S [[id number]]  . amphetamine-dextroamphetamine (ADDERALL XR) 20 MG 24 hr capsule    Sig: Take 2 capsules (40 mg total) by mouth every morning.    Dispense:  60 capsule    Refill:  0    May fill 90 days from 08/28/16   Switch to Zantac 300 mg per day. Call back if persists.  Given 3 monthly Rx by NP and a fourth by MD.  Return in about 4 months (around 12/29/2016) for ADHD recheck.

## 2016-11-11 ENCOUNTER — Other Ambulatory Visit: Payer: Self-pay | Admitting: Family Medicine

## 2016-11-29 ENCOUNTER — Other Ambulatory Visit: Payer: Self-pay | Admitting: Nurse Practitioner

## 2016-12-08 ENCOUNTER — Other Ambulatory Visit: Payer: Self-pay | Admitting: Family Medicine

## 2016-12-09 ENCOUNTER — Telehealth: Payer: Self-pay | Admitting: Family Medicine

## 2016-12-09 NOTE — Telephone Encounter (Signed)
Patient called because she is out of Adderall and pharmacy said she needs new prior auth. I checked with the nurse and we did receive a prior auth form from the pharmacy and they are working on those.  I told patient we would have to get insurance to approve and then we will inform the pharmacy. She should check back with them to see when approved.  Patient understood.

## 2016-12-15 ENCOUNTER — Encounter: Payer: Self-pay | Admitting: Nurse Practitioner

## 2017-01-08 ENCOUNTER — Encounter: Payer: Self-pay | Admitting: Nurse Practitioner

## 2017-01-08 ENCOUNTER — Ambulatory Visit (INDEPENDENT_AMBULATORY_CARE_PROVIDER_SITE_OTHER): Payer: BC Managed Care – PPO | Admitting: Nurse Practitioner

## 2017-01-08 VITALS — BP 120/76 | Ht 63.0 in | Wt 173.2 lb

## 2017-01-08 DIAGNOSIS — F902 Attention-deficit hyperactivity disorder, combined type: Secondary | ICD-10-CM

## 2017-01-08 MED ORDER — AMPHETAMINE-DEXTROAMPHET ER 20 MG PO CP24: 40.0000 mg | ORAL_CAPSULE | Freq: Every morning | ORAL | 0 refills | Status: DC

## 2017-01-08 NOTE — Progress Notes (Signed)
Subjective: Presents for recheck on ADD/ADHD today.  Compliant with medication: yes  Difficulty completing tasks or focusing: none  Side effects: none  Appetite: no change  Sleep: no change  Additional issues or questions: none  Objective: BP 120/76   Ht 5\' 3"  (1.6 m)   Wt 173 lb 4 oz (78.6 kg)   BMI 30.69 kg/m  NAD. Alert, oriented. Lungs clear. Heart RRR.   Assessment:  Problem List Items Addressed This Visit      Other   ADHD (attention deficit hyperactivity disorder) - Primary       Plan:  Meds ordered this encounter  Medications  . DISCONTD: amphetamine-dextroamphetamine (ADDERALL XR) 20 MG 24 hr capsule    Sig: Take 2 capsules (40 mg total) by mouth every morning.    Dispense:  60 capsule    Refill:  0    Order Specific Question:   Supervising Provider    Answer:   Merlyn AlbertLUKING, WILLIAM S [[id number]]  . DISCONTD: amphetamine-dextroamphetamine (ADDERALL XR) 20 MG 24 hr capsule    Sig: Take 2 capsules (40 mg total) by mouth every morning.    Dispense:  60 capsule    Refill:  0    May fill 30 days from 01/08/17    Order Specific Question:   Supervising Provider    Answer:   Merlyn AlbertLUKING, WILLIAM S [[id number]]  . amphetamine-dextroamphetamine (ADDERALL XR) 20 MG 24 hr capsule    Sig: Take 2 capsules (40 mg total) by mouth every morning.    Dispense:  60 capsule    Refill:  0    May fill 60 days from 01/08/17    Order Specific Question:   Supervising Provider    Answer:   Merlyn AlbertLUKING, WILLIAM S [[id number]]   Return in about 4 months (around 05/08/2017) for ADHD check up. Reminded about wellness physical.

## 2017-01-09 ENCOUNTER — Encounter: Payer: Self-pay | Admitting: Nurse Practitioner

## 2017-03-18 ENCOUNTER — Ambulatory Visit (INDEPENDENT_AMBULATORY_CARE_PROVIDER_SITE_OTHER): Payer: BC Managed Care – PPO | Admitting: Nurse Practitioner

## 2017-03-18 ENCOUNTER — Encounter: Payer: Self-pay | Admitting: Nurse Practitioner

## 2017-03-18 VITALS — BP 122/82 | Temp 98.0°F | Ht 67.0 in | Wt 172.0 lb

## 2017-03-18 DIAGNOSIS — J069 Acute upper respiratory infection, unspecified: Secondary | ICD-10-CM | POA: Diagnosis not present

## 2017-03-18 DIAGNOSIS — B9689 Other specified bacterial agents as the cause of diseases classified elsewhere: Secondary | ICD-10-CM | POA: Diagnosis not present

## 2017-03-18 MED ORDER — AZITHROMYCIN 250 MG PO TABS: ORAL_TABLET | ORAL | 0 refills | Status: DC

## 2017-03-18 MED ORDER — HYDROCODONE-HOMATROPINE 5-1.5 MG/5ML PO SYRP: 5.0000 mL | ORAL_SOLUTION | ORAL | 0 refills | Status: DC | PRN

## 2017-03-18 MED ORDER — PREDNISONE 20 MG PO TABS: ORAL_TABLET | ORAL | 0 refills | Status: DC

## 2017-03-19 ENCOUNTER — Encounter: Payer: Self-pay | Admitting: Nurse Practitioner

## 2017-03-19 NOTE — Progress Notes (Signed)
Subjective:  Presents for complaints of persistent cough and sore throat that began on 4/7. No fever. Headache mainly with coughing. Runny nose. Worsening cough over the past 3-4 days. Productive in the mornings. No color to mucus. No wheezing but chest tightness with prolonged cough mainly in the evenings. Ear pain has improved. No vomiting diarrhea or abdominal pain. Taking fluids well. Voiding normal limit. Minimal relief with Zyrtec Nasacort Pro Air and Mucinex.  Objective:   BP 122/82   Temp 98 F (36.7 C) (Oral)   Ht  (1.702 m)   Wt 172 lb (78 kg)   BMI 26.94 kg/m  NAD. Alert, oriented. TMs retracted, no erythema. Pharynx injected with green PND noted. Neck supple with mild soft anterior adenopathy. Lungs clear. Heart regular rate rhythm.  Assessment:  Bacterial upper respiratory infection    Plan:   Meds ordered this encounter  Medications  . DISCONTD: azithromycin (ZITHROMAX Z-PAK) 250 MG tablet    Sig: Take 2 tablets (500 mg) on  Day 1,  followed by 1 tablet (250 mg) once daily on Days 2 through 5.    Dispense:  6 each    Refill:  0    Order Specific Question:   Supervising Provider    Answer:   Merlyn Albert [[id number]]  . HYDROcodone-homatropine (HYCODAN) 5-1.5 MG/5ML syrup    Sig: Take 5 mLs by mouth every 4 (four) hours as needed.    Dispense:  90 mL    Refill:  0    Order Specific Question:   Supervising Provider    Answer:   Merlyn Albert [[id number]]  . predniSONE (DELTASONE) 20 MG tablet    Sig: 3 po qd x 3 d then 2 po qd x 3 d then 1 po qd x 2 d    Dispense:  17 tablet    Refill:  0    Order Specific Question:   Supervising Provider    Answer:   Merlyn Albert [[id number]]  . azithromycin (ZITHROMAX Z-PAK) 250 MG tablet    Sig: Take 2 tablets (500 mg) on  Day 1,  followed by 1 tablet (250 mg) once daily on Days 2 through 5.    Dispense:  6 each    Refill:  0    Order Specific Question:   Supervising Provider    Answer:   Merlyn Albert [[id number]]   Continue  OTC meds as directed. Call back in 48 hours if no improvement, sooner if worse.

## 2017-04-17 ENCOUNTER — Telehealth: Payer: Self-pay | Admitting: Family Medicine

## 2017-04-17 ENCOUNTER — Other Ambulatory Visit: Payer: Self-pay | Admitting: Nurse Practitioner

## 2017-04-17 MED ORDER — AMPHETAMINE-DEXTROAMPHET ER 20 MG PO CP24: 40.0000 mg | ORAL_CAPSULE | Freq: Every morning | ORAL | 0 refills | Status: DC

## 2017-04-17 NOTE — Telephone Encounter (Signed)
Printed Rx. Follow as planned on 6/1

## 2017-04-17 NOTE — Telephone Encounter (Signed)
Pt is requesting a refill on adderall. Please advise.

## 2017-04-17 NOTE — Telephone Encounter (Signed)
Prescription upfront for pick up. Patient notified. 

## 2017-04-17 NOTE — Telephone Encounter (Signed)
Last add check up 12/2016

## 2017-04-24 ENCOUNTER — Ambulatory Visit (INDEPENDENT_AMBULATORY_CARE_PROVIDER_SITE_OTHER): Payer: BC Managed Care – PPO | Admitting: Family Medicine

## 2017-04-24 ENCOUNTER — Encounter: Payer: Self-pay | Admitting: Family Medicine

## 2017-04-24 VITALS — BP 122/76 | Ht 67.0 in | Wt 172.4 lb

## 2017-04-24 DIAGNOSIS — G43009 Migraine without aura, not intractable, without status migrainosus: Secondary | ICD-10-CM

## 2017-04-24 DIAGNOSIS — R300 Dysuria: Secondary | ICD-10-CM | POA: Diagnosis not present

## 2017-04-24 DIAGNOSIS — F902 Attention-deficit hyperactivity disorder, combined type: Secondary | ICD-10-CM

## 2017-04-24 DIAGNOSIS — F321 Major depressive disorder, single episode, moderate: Secondary | ICD-10-CM

## 2017-04-24 LAB — POCT URINALYSIS DIPSTICK
Spec Grav, UA: 1.02 (ref 1.010–1.025)
pH, UA: 6 (ref 5.0–8.0)

## 2017-04-24 MED ORDER — AMPHETAMINE-DEXTROAMPHET ER 20 MG PO CP24: 40.0000 mg | ORAL_CAPSULE | Freq: Every morning | ORAL | 0 refills | Status: DC

## 2017-04-24 MED ORDER — CIPROFLOXACIN HCL 250 MG PO TABS: 250.0000 mg | ORAL_TABLET | Freq: Two times a day (BID) | ORAL | 0 refills | Status: DC

## 2017-04-24 NOTE — Progress Notes (Signed)
   Subjective:    Patient ID: [name], female    DOB: November 09, 1966, 51 y.o.   MRN: [id number]  HPI Patient arrives for a follow up on ADHD medication.  Patient states she is doing well on her medication.  Pt had a lot of headaches, some allergy and sinus in nature  Sinus but also migtaine, truly  Not rally exrcising, work late [employer]..  Patient notes ongoing compliance with antidepressant medication. No obvious side effects. Reports does not miss a dose. Overall continues to help depression substantially. No thoughts of homicide or suicide. Would like to maintain medication.  attention deficit and hyperactivity disorder  .ad  Does not always take medicine   Will get ore active in the summer , tries to walk nore  Pt started with urgency on Thursday, then sudden urge, hen had urge incont , then notes sudden urge  Low bk discomfor t on wed,   His of hysterectomy and bladder tack and cystocele etc.  overll down thru the yrs better  Compliant with ADHD medication. Feels it definitely helps. No major side effects with that.  Ongoing challenges with migraine headaches. Clinically stable on meds. Occasional use of Maxalt. No negative side effects. Does report substantially more stress lately  Results for orders placed or performed in visit on 04/24/17  POCT urinalysis dipstick  Result Value Ref Range   Color, UA     Clarity, UA     Glucose, UA     Bilirubin, UA     Ketones, UA     Spec Grav, UA 1.020 1.010 - 1.025   Blood, UA     pH, UA 6.0 5.0 - 8.0   Protein, UA     Urobilinogen, UA  0.2 or 1.0 E.U./dL   Nitrite, UA     Leukocytes, UA  Negative     Patient also reports urinary frequency.   Review of Systems No headache, no major weight loss or weight gain, no chest pain no back pain abdominal pain no change in bowel habits complete ROS otherwise negative     Objective:   Physical Exam Alert and oriented, vitals reviewed and stable, NAD ENT-TM's and  ext canals WNL bilat via otoscopic exam Soft palate, tonsils and post pharynx WNL via oropharyngeal exam Neck-symmetric, no masses; thyroid nonpalpable and nontender Pulmonary-no tachypnea or accessory muscle use; Clear without wheezes via auscultation Card--no abnrml murmurs, rhythm reg and rate WNL Carotid pulses symmetric, without bruits No CVA tenderness neuro exam intact  2-4 white blood cells per high-power field       Assessment & Plan:  Impression 1 potential UTI discussed also potential incontinence reemerging years after hysterectomy discussed #2 ADHD discussed control discussed maintain same meds #3 depression clinically stable discussed maintain same meds #4 migraine headaches ongoing need for meds discussed and refilled

## 2017-04-26 LAB — URINE CULTURE: Organism ID, Bacteria: NO GROWTH

## 2017-04-26 LAB — PLEASE NOTE

## 2017-05-25 ENCOUNTER — Other Ambulatory Visit: Payer: Self-pay | Admitting: Nurse Practitioner

## 2017-06-08 ENCOUNTER — Other Ambulatory Visit: Payer: Self-pay | Admitting: Family Medicine

## 2017-07-11 ENCOUNTER — Other Ambulatory Visit: Payer: Self-pay | Admitting: Family Medicine

## 2017-07-13 NOTE — Telephone Encounter (Signed)
Ok plus 2 ref 

## 2017-07-13 NOTE — Telephone Encounter (Signed)
Last seen 04/24/17 

## 2017-08-12 ENCOUNTER — Other Ambulatory Visit: Payer: Self-pay | Admitting: Family Medicine

## 2017-08-21 ENCOUNTER — Encounter: Payer: Self-pay | Admitting: Nurse Practitioner

## 2017-08-21 ENCOUNTER — Ambulatory Visit (INDEPENDENT_AMBULATORY_CARE_PROVIDER_SITE_OTHER): Payer: BC Managed Care – PPO | Admitting: Nurse Practitioner

## 2017-08-21 VITALS — BP 118/90 | Ht 67.0 in | Wt 168.1 lb

## 2017-08-21 DIAGNOSIS — Z79899 Other long term (current) drug therapy: Secondary | ICD-10-CM

## 2017-08-21 DIAGNOSIS — F902 Attention-deficit hyperactivity disorder, combined type: Secondary | ICD-10-CM

## 2017-08-21 DIAGNOSIS — Z13220 Encounter for screening for lipoid disorders: Secondary | ICD-10-CM | POA: Diagnosis not present

## 2017-08-21 DIAGNOSIS — R5383 Other fatigue: Secondary | ICD-10-CM | POA: Diagnosis not present

## 2017-08-21 MED ORDER — AMPHETAMINE-DEXTROAMPHET ER 20 MG PO CP24
40.0000 mg | ORAL_CAPSULE | Freq: Every morning | ORAL | 0 refills | Status: DC
Start: 2017-08-21 — End: 2017-08-21

## 2017-08-21 MED ORDER — AMPHETAMINE-DEXTROAMPHET ER 20 MG PO CP24: 40.0000 mg | ORAL_CAPSULE | Freq: Every morning | ORAL | 0 refills | Status: DC

## 2017-08-21 NOTE — Progress Notes (Signed)
Subjective: Presents for recheck on ADD/ADHD today.  Compliant with medication: yes  Difficulty completing tasks or focusing: overall well  Side effects: none  Appetite: no change  Sleep: no issues  Additional issues or questions: none  Objective: BP 118/90   Ht  (1.702 m)   Wt 168 lb 0.8 oz (76.2 kg)   BMI 26.32 kg/m  NAD. Alert, oriented. Lungs clear. Heart regular rate rhythm.  Assessment:  Problem List Items Addressed This Visit      Other   ADHD (attention deficit hyperactivity disorder) - Primary    Other Visit Diagnoses    High risk medication use       Relevant Orders   Basic metabolic panel   Hepatic function panel   Lipid screening       Relevant Orders   Lipid panel   Fatigue, unspecified type       Relevant Orders   VITAMIN D 25 Hydroxy (Vit-D Deficiency, Fractures)       Plan:  Meds ordered this encounter  Medications  . DISCONTD: amphetamine-dextroamphetamine (ADDERALL XR) 20 MG 24 hr capsule    Sig: Take 2 capsules (40 mg total) by mouth every morning.    Dispense:  60 capsule    Refill:  0    Order Specific Question:   Supervising Provider    Answer:   Merlyn Albert [[id number]]  . DISCONTD: amphetamine-dextroamphetamine (ADDERALL XR) 20 MG 24 hr capsule    Sig: Take 2 capsules (40 mg total) by mouth every morning.    Dispense:  60 capsule    Refill:  0    May fill 30 days from 08/21/17    Order Specific Question:   Supervising Provider    Answer:   Merlyn Albert [[id number]]  . DISCONTD: amphetamine-dextroamphetamine (ADDERALL XR) 20 MG 24 hr capsule    Sig: Take 2 capsules (40 mg total) by mouth every morning.    Dispense:  60 capsule    Refill:  0    May fill 60 days from 08/21/17    Order Specific Question:   Supervising Provider    Answer:   Merlyn Albert [[id number]]  . amphetamine-dextroamphetamine (ADDERALL XR) 20 MG 24 hr capsule    Sig: Take 2 capsules (40 mg total) by mouth every morning.    Dispense:  60 capsule    Refill:   0    May fill 90 days from 08/21/17   Given 3 monthly prescriptions by NP and a fourth by M.D. Routine lab work ordered. Gets flu vaccine at work. Gets regular preventive health physicals. Return in about 4 months (around 12/21/2017) for ADHD recheck.

## 2017-08-22 ENCOUNTER — Encounter: Payer: Self-pay | Admitting: Nurse Practitioner

## 2017-09-10 ENCOUNTER — Other Ambulatory Visit: Payer: Self-pay | Admitting: Nurse Practitioner

## 2017-09-16 ENCOUNTER — Other Ambulatory Visit: Payer: Self-pay | Admitting: Family Medicine

## 2017-09-16 NOTE — Telephone Encounter (Signed)
Last seen 08/21/17

## 2017-10-12 LAB — HEPATIC FUNCTION PANEL
ALK PHOS: 65 IU/L (ref 39–117)
ALT: 9 IU/L (ref 0–32)
AST: 14 IU/L (ref 0–40)
Albumin: 4.4 g/dL (ref 3.5–5.5)
BILIRUBIN TOTAL: 0.2 mg/dL (ref 0.0–1.2)
BILIRUBIN, DIRECT: 0.07 mg/dL (ref 0.00–0.40)
Total Protein: 6.2 g/dL (ref 6.0–8.5)

## 2017-10-12 LAB — BASIC METABOLIC PANEL
BUN/Creatinine Ratio: 18 (ref 9–23)
BUN: 14 mg/dL (ref 6–24)
CALCIUM: 9.4 mg/dL (ref 8.7–10.2)
CHLORIDE: 105 mmol/L (ref 96–106)
CO2: 26 mmol/L (ref 20–29)
CREATININE: 0.79 mg/dL (ref 0.57–1.00)
GFR calc non Af Amer: 86 mL/min/{1.73_m2} (ref >59)
GFR, EST AFRICAN AMERICAN: 99 mL/min/{1.73_m2} (ref >59)
Glucose: 85 mg/dL (ref 65–99)
Potassium: 4.7 mmol/L (ref 3.5–5.2)
Sodium: 144 mmol/L (ref 134–144)

## 2017-10-12 LAB — LIPID PANEL
Chol/HDL Ratio: 3.2 ratio (ref 0.0–4.4)
Cholesterol, Total: 213 mg/dL — ABNORMAL HIGH (ref 100–199)
HDL: 66 mg/dL (ref >39)
LDL Calculated: 131 mg/dL — ABNORMAL HIGH (ref 0–99)
Triglycerides: 78 mg/dL (ref 0–149)
VLDL CHOLESTEROL CAL: 16 mg/dL (ref 5–40)

## 2017-10-12 LAB — VITAMIN D 25 HYDROXY (VIT D DEFICIENCY, FRACTURES): Vit D, 25-Hydroxy: 38.7 ng/mL (ref 30.0–100.0)

## 2017-11-26 ENCOUNTER — Encounter: Payer: Self-pay | Admitting: Nurse Practitioner

## 2017-12-02 ENCOUNTER — Encounter: Payer: Self-pay | Admitting: Nurse Practitioner

## 2017-12-02 ENCOUNTER — Ambulatory Visit: Payer: BC Managed Care – PPO | Admitting: Nurse Practitioner

## 2017-12-02 VITALS — BP 110/74 | Ht 63.0 in | Wt 171.0 lb

## 2017-12-02 DIAGNOSIS — G43819 Other migraine, intractable, without status migrainosus: Secondary | ICD-10-CM | POA: Diagnosis not present

## 2017-12-02 DIAGNOSIS — F902 Attention-deficit hyperactivity disorder, combined type: Secondary | ICD-10-CM

## 2017-12-02 DIAGNOSIS — F321 Major depressive disorder, single episode, moderate: Secondary | ICD-10-CM

## 2017-12-02 MED ORDER — AMPHETAMINE-DEXTROAMPHET ER 20 MG PO CP24
40.0000 mg | ORAL_CAPSULE | Freq: Every morning | ORAL | 0 refills | Status: DC
Start: 2017-12-02 — End: 2017-12-02

## 2017-12-02 MED ORDER — BUPROPION HCL ER (XL) 300 MG PO TB24: 300.0000 mg | ORAL_TABLET | Freq: Every day | ORAL | 5 refills | Status: DC

## 2017-12-02 MED ORDER — AMPHETAMINE-DEXTROAMPHET ER 20 MG PO CP24: 40.0000 mg | ORAL_CAPSULE | Freq: Every morning | ORAL | 0 refills | Status: DC

## 2017-12-02 MED ORDER — BACLOFEN 10 MG PO TABS: 10.0000 mg | ORAL_TABLET | Freq: Three times a day (TID) | ORAL | 1 refills | Status: DC

## 2017-12-02 MED ORDER — RIZATRIPTAN BENZOATE 10 MG PO TABS: ORAL_TABLET | ORAL | 0 refills | Status: DC

## 2017-12-02 NOTE — Progress Notes (Signed)
Subjective: Presents for recheck on ADD/ADHD today.  Compliant with medication: yes  Difficulty completing tasks or focusing: doing well  Side effects: none  Appetite: no change  Sleep: not related to medication  Additional issues or questions: needs refill on her Wellbutrin which is working well for her depression. Uses a very rare Maxalt for migraine. Current Rx is expired. Also requests refill on Baclofen for occasional muscle spasms in the neck area.    Objective: BP 110/74   Ht 5\' 3"  (1.6 m)   Wt 171 lb (77.6 kg)   BMI 30.29 kg/m  NAD. Alert, oriented. Lungs clear. Heart RRR. Cheerful affect. Thoughts logical, coherent and relevant.   Assessment:  Problem List Items Addressed This Visit      Cardiovascular and Mediastinum   Migraine headache   Relevant Medications   baclofen (LIORESAL) 10 MG tablet   rizatriptan (MAXALT) 10 MG tablet   buPROPion (WELLBUTRIN XL) 300 MG 24 hr tablet     Other   ADHD (attention deficit hyperactivity disorder) - Primary   Depression   Relevant Medications   buPROPion (WELLBUTRIN XL) 300 MG 24 hr tablet       Plan:  Meds ordered this encounter  Medications  . baclofen (LIORESAL) 10 MG tablet    Sig: Take 1 tablet (10 mg total) by mouth 3 (three) times daily.    Dispense:  30 each    Refill:  1    Order Specific Question:   Supervising Provider    Answer:   Merlyn Albert [[id number]]  . rizatriptan (MAXALT) 10 MG tablet    Sig: TAKE 1 TAB AT ONSET OF MIGRAINE. MAY REPEAT IN 2 HOURS UP TO 3/DAY. LIMIT 2 DAYS/WEEK.    Dispense:  10 tablet    Refill:  0    Order Specific Question:   Supervising Provider    Answer:   Merlyn Albert [[id number]]  . buPROPion (WELLBUTRIN XL) 300 MG 24 hr tablet    Sig: Take 1 tablet (300 mg total) by mouth daily.    Dispense:  30 tablet    Refill:  5    Order Specific Question:   Supervising Provider    Answer:   Merlyn Albert [[id number]]  . DISCONTD: amphetamine-dextroamphetamine (ADDERALL XR) 20 MG  24 hr capsule    Sig: Take 2 capsules (40 mg total) by mouth every morning.    Dispense:  60 capsule    Refill:  0    May fill 60 days from 12/02/17    Order Specific Question:   Supervising Provider    Answer:   Merlyn Albert [[id number]]  . DISCONTD: amphetamine-dextroamphetamine (ADDERALL XR) 20 MG 24 hr capsule    Sig: Take 2 capsules (40 mg total) by mouth every morning.    Dispense:  60 capsule    Refill:  0    May fill 60 days from 12/02/17    Order Specific Question:   Supervising Provider    Answer:   Merlyn Albert [[id number]]  . DISCONTD: amphetamine-dextroamphetamine (ADDERALL XR) 20 MG 24 hr capsule    Sig: Take 2 capsules (40 mg total) by mouth every morning.    Dispense:  60 capsule    Refill:  0    May fill 30 days from 12/02/17    Order Specific Question:   Supervising Provider    Answer:   Merlyn Albert [[id number]]  . amphetamine-dextroamphetamine (ADDERALL XR) 20 MG 24 hr capsule    Sig:  Take 2 capsules (40 mg total) by mouth every morning.    Dispense:  60 capsule    Refill:  0    Order Specific Question:   Supervising Provider    Answer:   Riccardo DubinLUKING, WILLIAM S [[id number]]   Gets regular physicals.  Return in about 4 months (around 04/01/2018) for recheck.

## 2017-12-08 ENCOUNTER — Encounter: Payer: Self-pay | Admitting: Nurse Practitioner

## 2017-12-09 NOTE — Telephone Encounter (Signed)
I reviewed over [name]'s notes.  Teresa JonesCarolyn requested that medicine use 1-1/2 tablets daily therefore send in prescription for 1-1/2 tablet daily #45 with 1 refill follow-up with Teresa Jonesarolyn by early February.  Also please forward message to Teresa BlaseDebbie that in the future messages regarding medicine must come through [name]'s my chart and not through any other patients chart including [facility]

## 2018-01-07 ENCOUNTER — Other Ambulatory Visit: Payer: Self-pay | Admitting: *Deleted

## 2018-01-07 MED ORDER — AZITHROMYCIN 250 MG PO TABS: ORAL_TABLET | ORAL | 0 refills | Status: DC

## 2018-03-04 ENCOUNTER — Other Ambulatory Visit: Payer: Self-pay | Admitting: Nurse Practitioner

## 2018-03-16 ENCOUNTER — Ambulatory Visit: Payer: BC Managed Care – PPO | Admitting: Nurse Practitioner

## 2018-03-16 ENCOUNTER — Encounter: Payer: Self-pay | Admitting: Nurse Practitioner

## 2018-03-16 VITALS — BP 138/84 | Ht 63.0 in | Wt 168.0 lb

## 2018-03-16 DIAGNOSIS — F321 Major depressive disorder, single episode, moderate: Secondary | ICD-10-CM | POA: Diagnosis not present

## 2018-03-16 DIAGNOSIS — F902 Attention-deficit hyperactivity disorder, combined type: Secondary | ICD-10-CM | POA: Diagnosis not present

## 2018-03-16 DIAGNOSIS — K219 Gastro-esophageal reflux disease without esophagitis: Secondary | ICD-10-CM

## 2018-03-16 MED ORDER — AMPHETAMINE-DEXTROAMPHET ER 20 MG PO CP24: 40.0000 mg | ORAL_CAPSULE | Freq: Every morning | ORAL | 0 refills | Status: DC

## 2018-03-16 NOTE — Progress Notes (Signed)
Subjective: Presents for recheck on ADD/ADHD today.  Compliant with medication: yes  Difficulty completing tasks or focusing: not while on medication  Side effects: none  Appetite: good  Sleep: no change or new issues  Additional issues or questions: occasional reflux on Zantac; has been off Omeprazole; no abdominal pain Depression stable on Wellbutrin  Objective: BP 138/84   Ht 5\' 3"  (1.6 m)   Wt 168 lb (76.2 kg)   BMI 29.76 kg/m  NAD. Alert, oriented.  Calm affect.  Thoughts logical coherent and relevant.  Dressed appropriately.  Lungs clear.  Heart regular rate and rhythm.  Abdomen soft nontender.  Assessment:  Problem List Items Addressed This Visit      Digestive   Esophageal reflux     Other   ADHD (attention deficit hyperactivity disorder) - Primary   Depression       Plan:  Meds ordered this encounter  Medications  . DISCONTD: amphetamine-dextroamphetamine (ADDERALL XR) 20 MG 24 hr capsule    Sig: Take 2 capsules (40 mg total) by mouth every morning.    Dispense:  60 capsule    Refill:  0    Order Specific Question:   Supervising Provider    Answer:   Merlyn AlbertLUKING, WILLIAM S [[id number]]  . DISCONTD: amphetamine-dextroamphetamine (ADDERALL XR) 20 MG 24 hr capsule    Sig: Take 2 capsules (40 mg total) by mouth every morning.    Dispense:  60 capsule    Refill:  0    May fill 30 days from 03/16/18    Order Specific Question:   Supervising Provider    Answer:   Merlyn AlbertLUKING, WILLIAM S [[id number]]  . amphetamine-dextroamphetamine (ADDERALL XR) 20 MG 24 hr capsule    Sig: Take 2 capsules (40 mg total) by mouth every morning.    Dispense:  60 capsule    Refill:  0    May fill 60 days from 03/16/18    Order Specific Question:   Supervising Provider    Answer:   Merlyn AlbertLUKING, WILLIAM S [[id number]]   Recommend switching back to omeprazole as needed for breakthrough reflux symptoms and going back to Zantac once they are under control.  Continue current medication regimen.  Note patient sees  gynecology for her wellness exams. Return in about 4 months (around 07/16/2018) for ADD check up.

## 2018-04-26 ENCOUNTER — Other Ambulatory Visit: Payer: Self-pay | Admitting: Family Medicine

## 2018-04-26 ENCOUNTER — Telehealth: Payer: Self-pay | Admitting: Family Medicine

## 2018-04-26 MED ORDER — AZITHROMYCIN 250 MG PO TABS: ORAL_TABLET | ORAL | 0 refills | Status: DC

## 2018-04-26 NOTE — Telephone Encounter (Signed)
This patient came to the office with her daughter.  Her daughter was seen for strep throat.  The patient also states that she has been having sore throat on the right side for the past week despite not having tonsils and states that she is felt bad at times she did not appear toxic.  Her throat did have some mild erythema no masses were detected.  No significant adenopathy.  Zithromax was sent in for this patient.  The patient was told that if her throat does not totally resolve over the next 5 to 7 days it is important for her to schedule an office visit with Dr. Brett CanalesSteve and to follow-up sooner if any problems the patient agrees to do so.

## 2018-05-13 ENCOUNTER — Other Ambulatory Visit: Payer: Self-pay | Admitting: Nurse Practitioner

## 2018-06-02 ENCOUNTER — Other Ambulatory Visit: Payer: Self-pay | Admitting: Nurse Practitioner

## 2018-07-07 ENCOUNTER — Other Ambulatory Visit: Payer: Self-pay | Admitting: Family Medicine

## 2018-07-07 NOTE — Telephone Encounter (Signed)
One mo ok, needs o v before further

## 2018-07-09 ENCOUNTER — Telehealth: Payer: Self-pay | Admitting: Family Medicine

## 2018-07-09 NOTE — Telephone Encounter (Signed)
Pt states that the pharmacy stated she needed a physical Rx for her  amphetamine-dextroamphetamine (ADDERALL XR) 20 MG 24 hr capsule  Please call pt when ready to pick up  Pt has appt 08/03/18

## 2018-07-13 MED ORDER — AMPHETAMINE-DEXTROAMPHET ER 20 MG PO CP24: 40.0000 mg | ORAL_CAPSULE | Freq: Every morning | ORAL | 0 refills | Status: DC

## 2018-07-13 NOTE — Telephone Encounter (Signed)
Sorry, I didn't route this when message was taken

## 2018-07-13 NOTE — Telephone Encounter (Signed)
One mo worth, also notify pt

## 2018-07-13 NOTE — Telephone Encounter (Signed)
Prescription upfront for pick up. Patient notified. 

## 2018-08-03 ENCOUNTER — Ambulatory Visit: Payer: BC Managed Care – PPO | Admitting: Family Medicine

## 2018-08-20 ENCOUNTER — Ambulatory Visit (INDEPENDENT_AMBULATORY_CARE_PROVIDER_SITE_OTHER): Payer: BC Managed Care – PPO | Admitting: Family Medicine

## 2018-08-20 ENCOUNTER — Encounter: Payer: Self-pay | Admitting: Family Medicine

## 2018-08-20 VITALS — BP 126/92 | Ht 63.0 in | Wt 167.0 lb

## 2018-08-20 DIAGNOSIS — G43009 Migraine without aura, not intractable, without status migrainosus: Secondary | ICD-10-CM

## 2018-08-20 DIAGNOSIS — F321 Major depressive disorder, single episode, moderate: Secondary | ICD-10-CM

## 2018-08-20 DIAGNOSIS — F902 Attention-deficit hyperactivity disorder, combined type: Secondary | ICD-10-CM

## 2018-08-20 MED ORDER — RIZATRIPTAN BENZOATE 10 MG PO TABS: ORAL_TABLET | ORAL | 1 refills | Status: DC

## 2018-08-20 MED ORDER — BUPROPION HCL ER (XL) 300 MG PO TB24: 300.0000 mg | ORAL_TABLET | Freq: Every day | ORAL | 5 refills | Status: DC

## 2018-08-20 MED ORDER — AMPHETAMINE-DEXTROAMPHET ER 20 MG PO CP24
40.0000 mg | ORAL_CAPSULE | Freq: Every morning | ORAL | 0 refills | Status: DC
Start: 2018-08-20 — End: 2018-08-20

## 2018-08-20 MED ORDER — AMPHETAMINE-DEXTROAMPHET ER 20 MG PO CP24: 40.0000 mg | ORAL_CAPSULE | Freq: Every morning | ORAL | 0 refills | Status: DC

## 2018-08-20 NOTE — Progress Notes (Signed)
Subjective:    Patient ID: [name], female    DOB: 27-Oct-1966, 52 y.o.   MRN: [id number]  HPI  Patient was seen today for ADD checkup.  This patient does have ADD.  Patient takes medications for this.  If this does help control overall symptoms.  Please see below. -weight, vital signs reviewed.  The following items were covered. -Compliance with medication : Yes  -Problems with completing homework, paying attention/taking good notes in school: No  -grades: N/A  - Eating patterns : She states she eats too much; appetite is good.  -sleeping: Good, not affecting sleep  -Additional issues or questions: Takes zyrtec and it is the 24 hour pill,but it is not lasting 24 hours. Has tried allegra and claritin and not helpful. Reports trying a nasal spray years ago but had headaches from this. Main symptoms are nasal congestion and rhinorrhea, worse in the Fall season with ragweed.   States takes 2 per day when she is working. States takes 1 per day on the weekends, unless she's going for a long drive.   Migraines: doing well, maxalt is still helpful, occasionally needs zofran but not often  Depression: doing well on current dose of wellbutrin, would like to keep going.  Review of Systems  Constitutional: Negative for fever and unexpected weight change.  HENT: Positive for congestion and rhinorrhea.   Respiratory: Negative for shortness of breath.   Cardiovascular: Negative for chest pain and palpitations.  Psychiatric/Behavioral: Negative for sleep disturbance.       Objective:   Physical Exam  Constitutional: She is oriented to person, place, and time. She appears well-developed and well-nourished. No distress.  HENT:  Head: Normocephalic and atraumatic.  Right Ear: Tympanic membrane normal.  Left Ear: Tympanic membrane normal.  Nose: Mucosal edema present. No sinus tenderness.  Mouth/Throat: Oropharynx is clear and moist.  Eyes: Right eye exhibits no discharge.  Left eye exhibits no discharge.  Neck: Neck supple.  Cardiovascular: Normal rate, regular rhythm and normal heart sounds.  No murmur heard. Pulmonary/Chest: Effort normal and breath sounds normal. No respiratory distress. She has no wheezes.  Lymphadenopathy:    She has no cervical adenopathy.  Neurological: She is alert and oriented to person, place, and time. Coordination normal.  Skin: Skin is warm and dry.  Psychiatric: She has a normal mood and affect.  Nursing note and vitals reviewed.     Assessment & Plan:  1. Attention deficit hyperactivity disorder (ADHD), combined type Doing well on current dose of Adderall XR. Would like to continue this medication, no side effects. PMP checked. We will refill medication. F/u in 4 months.   2. Migraine without aura and without status migrainosus, not intractable Migraines currently well controlled with rare use of maxalt and zofran as needed. Will refill maxalt.  Pt with some mild diastolic htn today and on repeat BP check. Recommended she keep a log of her BPs at home and bring with her to her next appt. And we can evaluate this further at that time.  3. Current moderate episode of major depressive disorder without prior episode (HCC) Depression well-controlled on current dose of wellbutrin, will keep at this dose and refill for 6 months supply.   25 minutes was spent with the patient.  This statement verifies that 25 minutes was indeed spent with the patient.  More than 50% of this visit-total duration of the visit-was spent in counseling and coordination of care. The issues that the patient came in  for today as reflected in the diagnosis (s) please refer to documentation for further details. As attending physician to this patient visit, this patient was seen in conjunction with the nurse practitioner.  The history,physical and treatment plan was reviewed with the nurse practitioner and pertinent findings were verified with the patient.  Also  the treatment plan was reviewed with the patient while they were present. WSLMD

## 2018-09-08 ENCOUNTER — Other Ambulatory Visit: Payer: Self-pay | Admitting: Family Medicine

## 2018-10-04 ENCOUNTER — Encounter: Payer: Self-pay | Admitting: Family Medicine

## 2018-10-04 ENCOUNTER — Ambulatory Visit: Payer: BC Managed Care – PPO | Admitting: Family Medicine

## 2018-10-04 VITALS — BP 114/78 | Temp 98.2°F | Ht 63.0 in | Wt 168.0 lb

## 2018-10-04 DIAGNOSIS — J019 Acute sinusitis, unspecified: Secondary | ICD-10-CM | POA: Diagnosis not present

## 2018-10-04 MED ORDER — ALBUTEROL SULFATE HFA 108 (90 BASE) MCG/ACT IN AERS: 2.0000 | INHALATION_SPRAY | Freq: Four times a day (QID) | RESPIRATORY_TRACT | 2 refills | Status: DC | PRN

## 2018-10-04 MED ORDER — CEFDINIR 300 MG PO CAPS: 300.0000 mg | ORAL_CAPSULE | Freq: Two times a day (BID) | ORAL | 0 refills | Status: AC

## 2018-10-04 NOTE — Progress Notes (Signed)
   Subjective:    Patient ID: [name], female    DOB: Dec 29, 1965, 52 y.o.   MRN: [id number]  Sinusitis  This is a new problem. Episode onset: 2 weeks. Associated symptoms include coughing, ear pain, headaches and a sore throat. Pertinent negatives include no shortness of breath. Treatments tried: antihistamine, decongestants, expectorants, cough suppressants.   Needs refill on albuterol inhaler.   2 weeks ago with hoarseness, sore throat, cough productive of thick yellow/green mucous, now with left ear pain x 2 days. Has tried taking albuterol inhaler once, but noted it was expired.   Review of Systems  Constitutional: Positive for activity change. Negative for appetite change and fever.  HENT: Positive for ear pain and sore throat.   Respiratory: Positive for cough. Negative for shortness of breath.   Gastrointestinal: Negative for diarrhea, nausea and vomiting.  Neurological: Positive for headaches.       Objective:   Physical Exam  Constitutional: She is oriented to person, place, and time. She appears well-developed and well-nourished. No distress.  HENT:  Head: Normocephalic and atraumatic.  Right Ear: Tympanic membrane normal.  Left Ear: Tympanic membrane normal.  Nose: Nose normal. No sinus tenderness.  Mouth/Throat: Uvula is midline. Posterior oropharyngeal erythema present.  Eyes: Right eye exhibits no discharge. Left eye exhibits no discharge.  Neck: Neck supple.  Cardiovascular: Normal rate, regular rhythm and normal heart sounds.  No murmur heard. Pulmonary/Chest: Effort normal and breath sounds normal. No respiratory distress. She has no wheezes.  Lymphadenopathy:    She has no cervical adenopathy.  Neurological: She is alert and oriented to person, place, and time.  Skin: Skin is warm and dry.  Psychiatric: She has a normal mood and affect.  Nursing note and vitals reviewed.     Assessment & Plan:  Acute rhinosinusitis  Will treat with abx. Warning  signs discussed, f/u if symptoms worsen or fail to improve.

## 2018-10-29 ENCOUNTER — Ambulatory Visit: Payer: BC Managed Care – PPO | Admitting: Family Medicine

## 2018-10-29 VITALS — BP 122/76 | Temp 98.0°F | Ht 63.0 in | Wt 171.6 lb

## 2018-10-29 DIAGNOSIS — R070 Pain in throat: Secondary | ICD-10-CM

## 2018-10-29 DIAGNOSIS — J029 Acute pharyngitis, unspecified: Secondary | ICD-10-CM | POA: Diagnosis not present

## 2018-10-29 MED ORDER — NYSTATIN 100000 UNIT/ML MT SUSP: 5.0000 mL | Freq: Four times a day (QID) | OROMUCOSAL | 0 refills | Status: DC

## 2018-10-29 MED ORDER — PANTOPRAZOLE SODIUM 40 MG PO TBEC: 40.0000 mg | DELAYED_RELEASE_TABLET | Freq: Every day | ORAL | 3 refills | Status: DC

## 2018-10-29 NOTE — Progress Notes (Signed)
   Subjective:    Patient ID: [name], female    DOB: Nov 10, 1966, 52 y.o.   MRN: [id number]  HPI  Patient arrives with ongoing sore throat since September 20, 2018. Patient states she was seen and treated with a 10 day course of Omnicef 300mg  and finished that a week and a half ago but the sore throat did not go away. Patient states it does not feel like a sick sore throat- it is sore to swallow and to touch. Patient relates that she took antibiotics and it helped a little bit but she has had persistent sore throat she describes as a ache or soreness in the tenderness to touch in the middle portion of her neck left and right side lower aspect that is been present for the past few days she denies high fever chills sweats denies difficulty breathing at times she feels like it is hard to swallow Continued symptoms until day 8 antibiotic  Sore throat came back  Varies side to side Sore at times to the touvh occas headache Now midline Hurts to swallow Does not vary witrh food or liquid nofevers No congestion now No reflux sx   Review of Systems  Constitutional: Negative for activity change and fever.  HENT: Positive for sore throat. Negative for congestion, ear pain and rhinorrhea.   Eyes: Negative for discharge.  Respiratory: Negative for cough, shortness of breath and wheezing.   Cardiovascular: Negative for chest pain.       Objective:   Physical Exam  Constitutional: She appears well-developed.  HENT:  Head: Normocephalic.  Nose: Nose normal.  Mouth/Throat: Oropharynx is clear and moist. No oropharyngeal exudate.  Neck: Neck supple.  Cardiovascular: Normal rate and normal heart sounds.  No murmur heard. Pulmonary/Chest: Effort normal and breath sounds normal. She has no wheezes.  Lymphadenopathy:    She has no cervical adenopathy.  Skin: Skin is warm and dry.  Nursing note and vitals reviewed. Very nice patient. She does not appear to be in any distress she is  not having difficulty speaking or swallowing I do not find evidence of any drooling.  She was advised to stop ranitidine because of the risk of cancer causing agents with it     Assessment & Plan:  Highly sympathetic to what is going on with this patient It is hard to determine the exact cause of this She needs to see ENT we will set up an urgent referral We will go ahead with nystatin oral solution to cover for the possibility of thrush Also go ahead with a PPI to cover for the possibility of a reflux esophagitis issue She needs to see ENT so they can inspect her larynx We will go ahead and do lab work to check her thyroid because of tenderness in the anterior portion of her neck

## 2018-10-30 ENCOUNTER — Telehealth: Payer: Self-pay | Admitting: Family Medicine

## 2018-10-30 LAB — CBC WITH DIFFERENTIAL/PLATELET
Basophils Absolute: 0 10*3/uL (ref 0.0–0.2)
Basos: 1 %
EOS (ABSOLUTE): 0.1 10*3/uL (ref 0.0–0.4)
Eos: 2 %
Hematocrit: 39.2 % (ref 34.0–46.6)
Hemoglobin: 13.3 g/dL (ref 11.1–15.9)
IMMATURE GRANULOCYTES: 0 %
Immature Grans (Abs): 0 10*3/uL (ref 0.0–0.1)
Lymphocytes Absolute: 1.7 10*3/uL (ref 0.7–3.1)
Lymphs: 37 %
MCH: 31.6 pg (ref 26.6–33.0)
MCHC: 33.9 g/dL (ref 31.5–35.7)
MCV: 93 fL (ref 79–97)
Monocytes Absolute: 0.4 10*3/uL (ref 0.1–0.9)
Monocytes: 9 %
Neutrophils Absolute: 2.4 10*3/uL (ref 1.4–7.0)
Neutrophils: 51 %
Platelets: 281 10*3/uL (ref 150–450)
RBC: 4.21 x10E6/uL (ref 3.77–5.28)
RDW: 12.5 % (ref 12.3–15.4)
WBC: 4.7 10*3/uL (ref 3.4–10.8)

## 2018-10-30 LAB — SEDIMENTATION RATE: Sed Rate: 2 mm/hr (ref 0–40)

## 2018-10-30 LAB — T4, FREE: Free T4: 1.12 ng/dL (ref 0.82–1.77)

## 2018-10-30 LAB — TSH: TSH: 2.4 u[IU]/mL (ref 0.450–4.500)

## 2018-10-30 NOTE — Telephone Encounter (Signed)
[name]  This patient has severe throat pain down in the larynx it is hard to figure out exactly what is causing this.  I would like to get her in with ENT for them to do a laryngeal examination she is [facility] Norton Past[facility]Sarah [facility] She is [facility] who [facility] her the soonest Ideally I would like for her to be seen this week if possible Please see what you can do thank you so much Please let the patient be aware as well

## 2018-11-01 ENCOUNTER — Encounter: Payer: Self-pay | Admitting: Family Medicine

## 2018-11-12 DIAGNOSIS — Z9089 Acquired absence of other organs: Secondary | ICD-10-CM | POA: Diagnosis not present

## 2018-11-12 DIAGNOSIS — Z87891 Personal history of nicotine dependence: Secondary | ICD-10-CM | POA: Diagnosis not present

## 2018-11-12 DIAGNOSIS — K219 Gastro-esophageal reflux disease without esophagitis: Secondary | ICD-10-CM | POA: Diagnosis not present

## 2018-12-09 ENCOUNTER — Ambulatory Visit: Payer: BC Managed Care – PPO | Admitting: Family Medicine

## 2018-12-09 ENCOUNTER — Encounter: Payer: Self-pay | Admitting: Family Medicine

## 2018-12-09 VITALS — BP 120/72 | Ht 63.0 in | Wt 172.2 lb

## 2018-12-09 DIAGNOSIS — F902 Attention-deficit hyperactivity disorder, combined type: Secondary | ICD-10-CM | POA: Diagnosis not present

## 2018-12-09 DIAGNOSIS — F321 Major depressive disorder, single episode, moderate: Secondary | ICD-10-CM

## 2018-12-09 MED ORDER — AMPHETAMINE-DEXTROAMPHET ER 20 MG PO CP24: 40.0000 mg | ORAL_CAPSULE | Freq: Every morning | ORAL | 0 refills | Status: DC

## 2018-12-09 MED ORDER — AMPHETAMINE-DEXTROAMPHET ER 20 MG PO CP24: 40.0000 mg | ORAL_CAPSULE | ORAL | 0 refills | Status: DC

## 2018-12-09 MED ORDER — BUPROPION HCL ER (XL) 300 MG PO TB24: 300.0000 mg | ORAL_TABLET | Freq: Every day | ORAL | 5 refills | Status: DC

## 2018-12-09 NOTE — Progress Notes (Addendum)
   Subjective:    Patient ID: [name], female    DOB: 1966-05-04, 53 y.o.   MRN: [id number]  HPI  Patient arrives for a follow up on ADHD. Patient states she is doing well on her current medication which is Adderall XR 20mg  2qam. Patient reports no problems or concerns.  Takes everyday feels like helps focus and gets her work done. Working as a Tax adviser. Sometimes only takes 1 on weekends.   Denies decreased appetite or affect on sleep. Denies any other adverse effects.   Depression symptoms well controlled on wellbutrin. Compliant with medication. Denies adverse effects. Denies SI/HI.    Review of Systems  Constitutional: Negative for appetite change and unexpected weight change.  Respiratory: Negative for shortness of breath.   Cardiovascular: Negative for chest pain and palpitations.  Psychiatric/Behavioral: Negative for decreased concentration, dysphoric mood, sleep disturbance and suicidal ideas.       Objective:   Physical Exam Vitals signs and nursing note reviewed.  Constitutional:      General: She is not in acute distress.    Appearance: Normal appearance. She is not toxic-appearing.  HENT:     Head: Normocephalic and atraumatic.  Eyes:     General:        Right eye: No discharge.        Left eye: No discharge.  Neck:     Musculoskeletal: Neck supple. No neck rigidity.  Pulmonary:     Effort: Pulmonary effort is normal. No respiratory distress.  Lymphadenopathy:     Cervical: No cervical adenopathy.  Skin:    General: Skin is warm and dry.  Neurological:     Mental Status: She is alert and oriented to person, place, and time.  Psychiatric:        Mood and Affect: Mood normal.        Behavior: Behavior normal.        Thought Content: Thought content normal.     Depression screen [facility] 2/9 12/09/2018 03/16/2018  Decreased Interest 0 0  Down, Depressed, Hopeless 0 0  PHQ - 2 Score 0 0  Altered sleeping 0 -  Tired, decreased energy 0 -    Change in appetite 0 -  Feeling bad or failure about yourself  0 -  Trouble concentrating 0 -  Moving slowly or fidgety/restless 0 -  Suicidal thoughts 0 -  PHQ-9 Score 0 -  Difficult doing work/chores Not difficult at all -          Assessment & Plan:  1. Attention deficit hyperactivity disorder (ADHD), combined type The patient was seen today as part of the visit regarding ADD. Medications were reviewed with the patient as well as compliance. Side effects were checked for. Discussion regarding effectiveness was held. Prescriptions were written. Patient reminded to follow-up in approximately 3 months.   Complied with [facility] law, drug registry was checked and verified while present with the patient.  2. Current moderate episode of major depressive disorder without prior episode (HCC) Doing well on current dose of wellbutrin. Reports has been on this for a long time, feels symptoms are well controlled and wants to continue. Phq 9 reviewed with patient. Will refill. Recommend f/u in 6 months.

## 2018-12-10 ENCOUNTER — Ambulatory Visit: Payer: BC Managed Care – PPO | Admitting: Family Medicine

## 2018-12-13 ENCOUNTER — Ambulatory Visit: Payer: BC Managed Care – PPO | Admitting: Family Medicine

## 2019-01-24 ENCOUNTER — Other Ambulatory Visit: Payer: Self-pay | Admitting: Family Medicine

## 2019-02-18 ENCOUNTER — Telehealth: Payer: Self-pay | Admitting: Family Medicine

## 2019-02-18 MED ORDER — PANTOPRAZOLE SODIUM 40 MG PO TBEC: DELAYED_RELEASE_TABLET | ORAL | 0 refills | Status: DC

## 2019-02-18 NOTE — Telephone Encounter (Signed)
Ok times one 

## 2019-02-18 NOTE — Telephone Encounter (Signed)
Protonix sent in. Pt has script at [facility] for Adderall that can be picked up on 02/23/2019. Contacted patient and patient is aware. Pt wanted to make sure she had enough medication due to appt being cancelled. Informed patient to call back before running out next month and schedule appt. Pt verbalized understanding.

## 2019-02-18 NOTE — Telephone Encounter (Signed)
Patient needs refill on adderall 40 mg and protonix 40 mg had to reschedule appointment for 4/7. Call into [facility]

## 2019-03-01 ENCOUNTER — Ambulatory Visit: Payer: BC Managed Care – PPO | Admitting: Family Medicine

## 2019-03-07 ENCOUNTER — Ambulatory Visit (INDEPENDENT_AMBULATORY_CARE_PROVIDER_SITE_OTHER): Payer: BC Managed Care – PPO | Admitting: Family Medicine

## 2019-03-07 ENCOUNTER — Other Ambulatory Visit: Payer: Self-pay

## 2019-03-07 DIAGNOSIS — M25512 Pain in left shoulder: Secondary | ICD-10-CM | POA: Diagnosis not present

## 2019-03-07 DIAGNOSIS — F321 Major depressive disorder, single episode, moderate: Secondary | ICD-10-CM | POA: Diagnosis not present

## 2019-03-07 DIAGNOSIS — G43009 Migraine without aura, not intractable, without status migrainosus: Secondary | ICD-10-CM | POA: Diagnosis not present

## 2019-03-07 DIAGNOSIS — F902 Attention-deficit hyperactivity disorder, combined type: Secondary | ICD-10-CM | POA: Diagnosis not present

## 2019-03-07 DIAGNOSIS — G8929 Other chronic pain: Secondary | ICD-10-CM

## 2019-03-07 MED ORDER — AMPHETAMINE-DEXTROAMPHET ER 20 MG PO CP24: 40.0000 mg | ORAL_CAPSULE | ORAL | 0 refills | Status: DC

## 2019-03-07 MED ORDER — PANTOPRAZOLE SODIUM 40 MG PO TBEC: DELAYED_RELEASE_TABLET | ORAL | 5 refills | Status: DC

## 2019-03-07 MED ORDER — AMPHETAMINE-DEXTROAMPHET ER 20 MG PO CP24: 40.0000 mg | ORAL_CAPSULE | Freq: Every morning | ORAL | 0 refills | Status: DC

## 2019-03-07 MED ORDER — BUPROPION HCL ER (XL) 300 MG PO TB24: 300.0000 mg | ORAL_TABLET | Freq: Every day | ORAL | 5 refills | Status: DC

## 2019-03-07 MED ORDER — RIZATRIPTAN BENZOATE 10 MG PO TABS: ORAL_TABLET | ORAL | 5 refills | Status: DC

## 2019-03-07 NOTE — Progress Notes (Signed)
   Subjective:    Patient ID: [name], female    DOB: April 24, 1966, 53 y.o.   MRN: [id number] Video plus audio visit HPI  Patient calls for a refill on her Adderall. Patient states she is doing well with her current medication and is having no problems or concerns with the medication.  Patient states she has been having left shoulder pain in the front since January.  Recalls no sudden injury.  Recalls no overuse.  Notes a bit of swelling.  Swelling is most predominant at the sternoclavicular joint on the left side.  Pain when laying on that side.  No cough no neck pain no chest pain no shortness of breath.  Virtual Visit via Video Note  I connected with [name] on 03/07/19 at  9:30 AM EDT by a video enabled telemedicine application and verified that I am speaking with the correct person using two identifiers.   I discussed the limitations of evaluation and management by telemedicine and the availability of in person appointments. The patient expressed understanding and agreed to proceed.  History of Present Illness:    Observations/Objective:   Assessment and Plan:   Follow Up Instructions:    I discussed the assessment and treatment plan with the patient. The patient was provided an opportunity to ask questions and all were answered. The patient agreed with the plan and demonstrated an understanding of the instructions.   The patient was advised to call back or seek an in-person evaluation if the symptoms worsen or if the condition fails to improve as anticipated.  I provided 25 minutes of non-face-to-face time during this encounter.  Migraine headaches overall stable.  Rarely needs Maxalt.  Generally in good control.   Patient notes ongoing compliance with antidepressant medication. No obvious side effects. Reports does not miss a dose. Overall continues to help depression substantially. No thoughts of homicide or suicide. Would like to maintain  medication.    Review of Systems No headache, no major weight loss or weight gain, no chest pain no back pain abdominal pain no change in bowel habits complete ROS otherwise negative     Objective:   Physical Exam   Shoulder not examined but points to sternoclavicular joint is region of main pain and swelling impression 1     Assessment & Plan:  Impression #1 ADHD.  Good control discussed to maintain same meds  2.  Migraine headaches.  Clinically stable to maintain same #3 depression clinically stable.  Also with element of anxiety patient maintain current meds  4.  Shoulder pain.  Likely sternoclavicular joint inflammation discussed no further work-up at this time local measures discussed

## 2019-08-03 ENCOUNTER — Ambulatory Visit (INDEPENDENT_AMBULATORY_CARE_PROVIDER_SITE_OTHER): Payer: BC Managed Care – PPO | Admitting: Family Medicine

## 2019-08-03 ENCOUNTER — Encounter: Payer: Self-pay | Admitting: Family Medicine

## 2019-08-03 ENCOUNTER — Other Ambulatory Visit: Payer: Self-pay

## 2019-08-03 DIAGNOSIS — B029 Zoster without complications: Secondary | ICD-10-CM

## 2019-08-03 MED ORDER — VALACYCLOVIR HCL 1 G PO TABS: 1000.0000 mg | ORAL_TABLET | Freq: Three times a day (TID) | ORAL | 0 refills | Status: DC

## 2019-08-03 NOTE — Progress Notes (Addendum)
   Subjective:  Audio plus video  Patient ID: [name], female    DOB: 1966/09/11, 53 y.o.   MRN: [id number]  HPI  Patient calls with shingles on lower back and her thigh. Patient has a history of shingles. The areas are very painful  Virtual Visit via Video Note  I connected with [name] on 08/03/19 at 10:30 AM EDT by a video enabled telemedicine application and verified that I am speaking with the correct person using two identifiers.  Location: Patient: home Provider: office   I discussed the limitations of evaluation and management by telemedicine and the availability of in person appointments. The patient expressed understanding and agreed to proceed.  History of Present Illness:    Observations/Objective:   Assessment and Plan:   Follow Up Instructions:    I discussed the assessment and treatment plan with the patient. The patient was provided an opportunity to ask questions and all were answered. The patient agreed with the plan and demonstrated an understanding of the instructions.   The patient was advised to call back or seek an in-person evaluation if the symptoms worsen or if the condition fails to improve as anticipated.  18 minutes of non-face-to-face time during this encounter.   Patient has had similar bouts in the past.  1 every couple of years or so.  Diagnosed with shingles in the past.  No fever no chills.  Classic neuropathic pain.  Burning discomfort hypersensitivity.  Several clusters of rash blistering in nature along a dermatomal pattern   Review of Systems No headache no chest pain no abdominal pain    Objective:   Physical Exam  Virtual      Assessment & Plan:  Impression probable shingles discussed Valtrex 3 times daily 7 days symptom care discussed warning signs

## 2019-08-30 ENCOUNTER — Other Ambulatory Visit: Payer: Self-pay | Admitting: Family Medicine

## 2019-08-31 NOTE — Telephone Encounter (Signed)
Call pt, sched f u appt, then may ref times one

## 2019-09-05 ENCOUNTER — Other Ambulatory Visit: Payer: Self-pay | Admitting: Family Medicine

## 2019-09-06 NOTE — Telephone Encounter (Signed)
[facility] - need to schedule 

## 2019-09-14 ENCOUNTER — Ambulatory Visit (INDEPENDENT_AMBULATORY_CARE_PROVIDER_SITE_OTHER): Payer: BC Managed Care – PPO | Admitting: Family Medicine

## 2019-09-14 DIAGNOSIS — F902 Attention-deficit hyperactivity disorder, combined type: Secondary | ICD-10-CM

## 2019-09-14 DIAGNOSIS — F321 Major depressive disorder, single episode, moderate: Secondary | ICD-10-CM

## 2019-09-14 DIAGNOSIS — G43009 Migraine without aura, not intractable, without status migrainosus: Secondary | ICD-10-CM | POA: Diagnosis not present

## 2019-09-14 MED ORDER — BUPROPION HCL ER (XL) 300 MG PO TB24: 300.0000 mg | ORAL_TABLET | Freq: Every day | ORAL | 5 refills | Status: DC

## 2019-09-14 NOTE — Progress Notes (Signed)
   Subjective:  Audio plus video  Patient ID: [name], female    DOB: 1966/05/24, 53 y.o.   MRN: [id number]  HPI  Patient calls for a follow up on ADHD. Patient os doing well on current medication and has no concerns.  Virtual Visit via Video Note  I connected with [name] on 09/14/19 at  3:00 PM EDT by a video enabled telemedicine application and verified that I am speaking with the correct person using two identifiers.  Location: Patient: home Provider: office   I discussed the limitations of evaluation and management by telemedicine and the availability of in person appointments. The patient expressed understanding and agreed to proceed.  History of Present Illness:    Observations/Objective:   Assessment and Plan:   Follow Up Instructions:    I discussed the assessment and treatment plan with the patient. The patient was provided an opportunity to ask questions and all were answered. The patient agreed with the plan and demonstrated an understanding of the instructions.   The patient was advised to call back or seek an in-person evaluation if the symptoms worsen or if the condition fails to improve as anticipated.  I provided 25 minutes of non-face-to-face time during this encounter.   Patient notes migraine headaches overall are stable.  Still occurring on occasion.  Maxalt still definitely helps  Compliant with Protonix.  No obvious side effects.  Uses regularly.  Wellbutrin completely compliant.  Notes that it is still definitely helping her mood.  Has an element of both anxiety and depression and has noted in the past it definitely helps and would like to maintain it  ADHD overall good control with current medications.  Compliant no obvious side effects     Review of Systems No headache, no major weight loss or weight gain, no chest pain no back pain abdominal pain no change in bowel habits complete ROS otherwise negative      Objective:   Physical Exam  Virtual      Assessment & Plan:  Impression ADHD.  No obvious side effects.  Definitely helping.  Good control discussed to maintain same meds diet exercise discussed  2.  Migraine headaches.  Clinically stable patient to maintain same medicine  3.  History of depression.  Clinically stable.  She notes ongoing good control with current medication.  Medications refilled diet exercise discussed.  Flu shot recommended

## 2019-09-15 ENCOUNTER — Telehealth: Payer: Self-pay | Admitting: Family Medicine

## 2019-09-15 MED ORDER — AMPHETAMINE-DEXTROAMPHET ER 20 MG PO CP24: 40.0000 mg | ORAL_CAPSULE | ORAL | 0 refills | Status: DC

## 2019-09-15 MED ORDER — AMPHETAMINE-DEXTROAMPHET ER 20 MG PO CP24: 40.0000 mg | ORAL_CAPSULE | Freq: Every morning | ORAL | 0 refills | Status: DC

## 2019-09-15 NOTE — Telephone Encounter (Signed)
Pt contacted and verbalized understanding.  

## 2019-09-15 NOTE — Telephone Encounter (Signed)
Please advise. Thank you

## 2019-09-15 NOTE — Telephone Encounter (Signed)
Sorry, went home warn out last night and did not sign off of them, done now

## 2019-09-15 NOTE — Telephone Encounter (Signed)
Patient was seen yesterday and her medication was forgot to send into [facility] her Adderrall XR 20 mh

## 2019-09-18 ENCOUNTER — Encounter: Payer: Self-pay | Admitting: Family Medicine

## 2019-09-26 NOTE — Telephone Encounter (Signed)
Patient had follow up visit for medication refills on 10/21

## 2019-09-26 NOTE — Telephone Encounter (Signed)
Surprised we did not do, lets do four mo worth

## 2019-09-27 MED ORDER — AMPHETAMINE-DEXTROAMPHET ER 20 MG PO CP24: ORAL_CAPSULE | ORAL | 0 refills | Status: DC

## 2019-09-27 MED ORDER — AMPHETAMINE-DEXTROAMPHET ER 20 MG PO CP24: 40.0000 mg | ORAL_CAPSULE | ORAL | 0 refills | Status: DC

## 2019-09-27 MED ORDER — AMPHETAMINE-DEXTROAMPHET ER 20 MG PO CP24: 40.0000 mg | ORAL_CAPSULE | Freq: Every morning | ORAL | 0 refills | Status: DC

## 2019-10-25 ENCOUNTER — Other Ambulatory Visit: Payer: Self-pay | Admitting: Family Medicine

## 2019-11-25 ENCOUNTER — Other Ambulatory Visit: Payer: Self-pay | Admitting: Family Medicine

## 2019-12-28 ENCOUNTER — Encounter: Payer: Self-pay | Admitting: Family Medicine

## 2020-01-16 ENCOUNTER — Telehealth: Payer: Self-pay | Admitting: *Deleted

## 2020-01-16 NOTE — Telephone Encounter (Signed)
Fax from [employer]. Coverage for amphetamine-dextroamphet er 20mg  or cp24 is covered from 01/13/20 -01/12/23. Left message to return call to notify pt and to see which pharm she gets at so I can notify pharm.

## 2020-01-16 NOTE — Telephone Encounter (Signed)
Called [facility] and it did go through still waiting for pt to call back to let her know.

## 2020-01-16 NOTE — Telephone Encounter (Signed)
Pt.notified

## 2020-01-28 ENCOUNTER — Other Ambulatory Visit: Payer: Self-pay | Admitting: Family Medicine

## 2020-01-30 NOTE — Telephone Encounter (Signed)
Last med check up 09/14/19

## 2020-03-09 ENCOUNTER — Other Ambulatory Visit: Payer: Self-pay | Admitting: Family Medicine

## 2020-04-05 ENCOUNTER — Telehealth (INDEPENDENT_AMBULATORY_CARE_PROVIDER_SITE_OTHER): Payer: BC Managed Care – PPO | Admitting: Family Medicine

## 2020-04-05 ENCOUNTER — Telehealth: Payer: Self-pay | Admitting: Family Medicine

## 2020-04-05 ENCOUNTER — Other Ambulatory Visit: Payer: Self-pay

## 2020-04-05 DIAGNOSIS — F902 Attention-deficit hyperactivity disorder, combined type: Secondary | ICD-10-CM

## 2020-04-05 DIAGNOSIS — F321 Major depressive disorder, single episode, moderate: Secondary | ICD-10-CM

## 2020-04-05 DIAGNOSIS — G43009 Migraine without aura, not intractable, without status migrainosus: Secondary | ICD-10-CM | POA: Diagnosis not present

## 2020-04-05 MED ORDER — PANTOPRAZOLE SODIUM 40 MG PO TBEC: DELAYED_RELEASE_TABLET | ORAL | 5 refills | Status: DC

## 2020-04-05 MED ORDER — AMPHETAMINE-DEXTROAMPHET ER 20 MG PO CP24: 40.0000 mg | ORAL_CAPSULE | Freq: Every morning | ORAL | 0 refills | Status: DC

## 2020-04-05 MED ORDER — AMPHETAMINE-DEXTROAMPHET ER 20 MG PO CP24: ORAL_CAPSULE | ORAL | 0 refills | Status: DC

## 2020-04-05 MED ORDER — AMPHETAMINE-DEXTROAMPHET ER 20 MG PO CP24: 40.0000 mg | ORAL_CAPSULE | ORAL | 0 refills | Status: DC

## 2020-04-05 MED ORDER — RIZATRIPTAN BENZOATE 10 MG PO TABS: ORAL_TABLET | ORAL | 5 refills | Status: DC

## 2020-04-05 MED ORDER — BUPROPION HCL ER (XL) 300 MG PO TB24: 300.0000 mg | ORAL_TABLET | Freq: Every day | ORAL | 5 refills | Status: DC

## 2020-04-05 NOTE — Progress Notes (Signed)
   Subjective:  Audio only  Patient ID: [name], female    DOB: 07-12-1966, 54 y.o.   MRN: [id number]  HPI Patient was seen today for ADD checkup.  This patient does have ADD.  Patient takes medications for this.  If this does help control overall symptoms.  Please see below. -weight, vital signs reviewed.  The following items were covered. -Compliance with medication : Adderall 20 mg 2 tablets each morning  -Problems with completing homework, paying attention/taking good notes in school: none  -grades: n  - Eating patterns : eats well  -sleeping: no issues with sleeping  -Additional issues or questions: none  Virtual Visit via Telephone Note  I connected with [name] on 04/05/20 at  9:30 AM EDT by telephone and verified that I am speaking with the correct person using two identifiers.  Location: Patient: home Provider: office   I discussed the limitations, risks, security and privacy concerns of performing an evaluation and management service by telephone and the availability of in person appointments. I also discussed with the patient that there may be a patient responsible charge related to this service. The patient expressed understanding and agreed to proceed.   History of Present Illness:    Observations/Objective:   Assessment and Plan:   Follow Up Instructions:    I discussed the assessment and treatment plan with the patient. The patient was provided an opportunity to ask questions and all were answered. The patient agreed with the plan and demonstrated an understanding of the instructions.   The patient was advised to call back or seek an in-person evaluation if the symptoms worsen or if the condition fails to improve as anticipated.  I provided 23 minutes of non-face-to-face time during this encounter.   Patient reports her ADHD medication definitely is still helping.  No major side effects.  Primarily takes Monday through Friday.   Helps her focus.  Migraine headaches have calm down considerably  Patient notes her mood disorder stable.  Compliant with Wellbutrin.  Definitely helps.  No negative side effects    Review of Systems No headaches currently    Objective:   Physical Exam  Virtual      Assessment & Plan:  Impression ADHD.  Good control discussed maintain same meds 4 months rhythm.  Patient educated that 3 months worth may be our next in the future  2.  Migraine headaches considerably improved  3.  Depression clinically stable  Medications written.  Diet exercise discussed

## 2020-04-05 NOTE — Telephone Encounter (Signed)
Ms. kyah, buesing are scheduled for a virtual visit with your provider today.    Just as we do with appointments in the office, we must obtain your consent to participate.  Your consent will be active for this visit and any virtual visit you may have with one of our providers in the next 365 days.    If you have a MyChart account, I can also send a copy of this consent to you electronically.  All virtual visits are billed to your insurance company just like a traditional visit in the office.  As this is a virtual visit, video technology does not allow for your provider to perform a traditional examination.  This may limit your provider's ability to fully assess your condition.  If your provider identifies any concerns that need to be evaluated in person or the need to arrange testing such as labs, EKG, etc, we will make arrangements to do so.    Although advances in technology are sophisticated, we cannot ensure that it will always work on either your end or our end.  If the connection with a video visit is poor, we may have to switch to a telephone visit.  With either a video or telephone visit, we are not always able to ensure that we have a secure connection.   I need to obtain your verbal consent now.   Are you willing to proceed with your visit today?   FALLEN CRISOSTOMO has provided verbal consent on 04/05/2020 for a virtual visit (video or telephone).   Marlowe Shores, LPN [id number]  5:64 AM

## 2020-10-16 ENCOUNTER — Ambulatory Visit (INDEPENDENT_AMBULATORY_CARE_PROVIDER_SITE_OTHER): Payer: BC Managed Care – PPO | Admitting: Family Medicine

## 2020-10-16 ENCOUNTER — Other Ambulatory Visit: Payer: Self-pay

## 2020-10-16 VITALS — HR 92 | Temp 97.7°F | Resp 16

## 2020-10-16 DIAGNOSIS — B9689 Other specified bacterial agents as the cause of diseases classified elsewhere: Secondary | ICD-10-CM | POA: Diagnosis not present

## 2020-10-16 DIAGNOSIS — J019 Acute sinusitis, unspecified: Secondary | ICD-10-CM | POA: Diagnosis not present

## 2020-10-16 DIAGNOSIS — J029 Acute pharyngitis, unspecified: Secondary | ICD-10-CM

## 2020-10-16 DIAGNOSIS — R059 Cough, unspecified: Secondary | ICD-10-CM | POA: Diagnosis not present

## 2020-10-16 LAB — POCT RAPID STREP A (OFFICE): Rapid Strep A Screen: NEGATIVE

## 2020-10-16 MED ORDER — AZITHROMYCIN 250 MG PO TABS: ORAL_TABLET | ORAL | 0 refills | Status: DC

## 2020-10-16 NOTE — Progress Notes (Signed)
Pt here for cough, dry sore throat, drainage, headache and sinus pressure. Began on Friday but has worsened. Pt had negative COVID test on 10/13/20. Pt has taken OTC meds to help.     Patient ID: [name], female    DOB: 06-16-1966, 54 y.o.   MRN: [id number]   Chief Complaint  Patient presents with  . Cough   Subjective:  CC: cough, sore throat, drainage, headache, sinus pressure  Presents today for an acute outside visit with a complaint of dry cough, sore throat, postnasal drainage, headache and sinus pressure.  This is a new problem, symptoms started on Friday and have progressively worsened.  Pertinent negatives include no fever and no abdominal pain.  Associated symptoms include headache, sore throat, ear pain and sinus pain and pressure.  Her daughter has been positive strep recently, her last negative Covid test was on November 20.  She is fully vaccinated from COVID-19.    Medical History [name] has a past medical history of ADD (attention deficit disorder), Depression, Hyperlipidemia, Migraine headache, Plantar fasciitis, and Reflux.   Outpatient Encounter Medications as of 10/16/2020  Medication Sig  . albuterol (PROVENTIL HFA;VENTOLIN HFA) 108 (90 Base) MCG/ACT inhaler Inhale 2 puffs into the lungs every 6 (six) hours as needed for wheezing.  Marland Kitchen amphetamine-dextroamphetamine (ADDERALL XR) 20 MG 24 hr capsule Take 2 capsules (40 mg total) by mouth every morning.  Marland Kitchen amphetamine-dextroamphetamine (ADDERALL XR) 20 MG 24 hr capsule Take 2 capsules (40 mg total) by mouth every morning.  Marland Kitchen amphetamine-dextroamphetamine (ADDERALL XR) 20 MG 24 hr capsule Take 2 capsules (40 mg total) by mouth every morning.  Marland Kitchen amphetamine-dextroamphetamine (ADDERALL XR) 20 MG 24 hr capsule Take 2 capsules by mouth every morning  . amphetamine-dextroamphetamine (ADDERALL XR) 20 MG 24 hr capsule Take 2 capsules (40 mg total) by mouth every morning.  Marland Kitchen buPROPion (WELLBUTRIN XL) 300 MG 24 hr  tablet Take 1 tablet (300 mg total) by mouth daily.  . Glucosamine-Chondroitin (GLUCOSAMINE CHONDR COMPLEX PO) Take by mouth daily.  . Multiple Vitamin (MULTIVITAMIN) tablet Take 1 tablet by mouth daily.  . ondansetron (ZOFRAN ODT) 4 MG disintegrating tablet Take 1 tablet (4 mg total) by mouth every 8 (eight) hours as needed for nausea or vomiting.  . pantoprazole (PROTONIX) 40 MG tablet TAKE (1) TABLET BY MOUTH ONCE DAILY.  . rizatriptan (MAXALT) 10 MG tablet TAKE 1 TAB AT ONSET OF MIGRAINE. MAY REPEAT IN 2 HOURS UP TO 3/DAY. LIMIT 2 DAYS/WEEK.  . valACYclovir (VALTREX) 1000 MG tablet Take 1 tablet (1,000 mg total) by mouth 3 (three) times daily.  Marland Kitchen azithromycin (ZITHROMAX) 250 MG tablet Take 2 tablets today, then one tablet for 4 days by mouth   No facility-administered encounter medications on file as of 10/16/2020.     Review of Systems  Constitutional: Negative for fever.  HENT: Positive for ear pain, sinus pressure, sinus pain and sore throat.   Respiratory: Positive for cough.   Neurological: Positive for headaches.     Vitals Pulse 92   Temp 97.7 F (36.5 C)   Resp 16   SpO2 97%   Objective:   Physical Exam Vitals and nursing note reviewed.  Constitutional:      General: She is not in acute distress.    Appearance: Normal appearance. She is not toxic-appearing.  HENT:     Right Ear: Tympanic membrane is erythematous.     Left Ear: Tympanic membrane normal.     Nose:  Right Sinus: Maxillary sinus tenderness and frontal sinus tenderness present.     Left Sinus: Maxillary sinus tenderness and frontal sinus tenderness present.     Mouth/Throat:     Mouth: Mucous membranes are moist.     Pharynx: Posterior oropharyngeal erythema present.  Cardiovascular:     Rate and Rhythm: Normal rate and regular rhythm.     Heart sounds: Normal heart sounds.  Pulmonary:     Effort: Pulmonary effort is normal.     Breath sounds: Normal breath sounds.  Skin:    General: Skin  is warm and dry.  Neurological:     Mental Status: She is alert and oriented to person, place, and time.  Psychiatric:        Mood and Affect: Mood normal.        Behavior: Behavior normal.        Thought Content: Thought content normal.        Judgment: Judgment normal.    Results for orders placed or performed in visit on 10/16/20  Grp A Strep   Specimen: Throat   Throat  Result Value Ref Range   Strep Gp A Direct, DNA Probe Negative Negative  POCT rapid strep A  Result Value Ref Range   Rapid Strep A Screen Negative Negative     Assessment and Plan   1. Cough - POCT rapid strep A - Novel Coronavirus, NAA ([facility])  2. Sore throat - POCT rapid strep A - Grp A Strep  3. Acute bacterial rhinosinusitis - azithromycin (ZITHROMAX) 250 MG tablet; Take 2 tablets today, then one tablet for 4 days by mouth  Dispense: 6 tablet; Refill: 0   Rapid strep negative, will send for culture.  Significant maxillary and frontal sinus pain upon palpation, will treat for acute bacterial rhinosinusitis.  Will notify once Covid test results are available.  Agrees with plan of care discussed today. Understands warning signs to seek further care: Chest pain, shortness of breath, any significant change in health. Understands to follow-up if symptoms do not improve, or worsen.    Novella Olive, NP 10/17/2020

## 2020-10-17 ENCOUNTER — Encounter: Payer: Self-pay | Admitting: Family Medicine

## 2020-10-17 LAB — STREP A DNA PROBE: Strep Gp A Direct, DNA Probe: NEGATIVE

## 2020-10-18 LAB — NOVEL CORONAVIRUS, NAA: SARS-CoV-2, NAA: NOT DETECTED

## 2020-10-18 LAB — SARS-COV-2, NAA 2 DAY TAT

## 2020-10-18 LAB — SPECIMEN STATUS REPORT

## 2020-10-31 ENCOUNTER — Telehealth: Payer: Self-pay | Admitting: Family Medicine

## 2020-10-31 NOTE — Telephone Encounter (Signed)
Pharmacy requesting refill on Bupropion HCL XL 300 mg tablets. Take one tablet po once daily.  Also requesting refill on Pantoprazole 40 mg tablet. Take one tablet po daily.  Pt has appt scheduled for 11/12/20. Please advise. Thank you

## 2020-10-31 NOTE — Telephone Encounter (Signed)
Call pt and see what is going on with the wellbutrin?  Hasn't had filled since may 2021 and only had 30 tablets.  Pls give refill of 30 tab of protonix 40mg  till her visit. Thx. Dr. 

## 2020-11-01 MED ORDER — PANTOPRAZOLE SODIUM 40 MG PO TBEC
DELAYED_RELEASE_TABLET | ORAL | 0 refills | Status: DC
Start: 2020-11-01 — End: 2020-12-03

## 2020-11-01 MED ORDER — BUPROPION HCL ER (XL) 300 MG PO TB24
300.0000 mg | ORAL_TABLET | Freq: Every day | ORAL | 0 refills | Status: DC
Start: 2020-11-01 — End: 2020-11-12

## 2020-11-01 NOTE — Telephone Encounter (Signed)
Pt.notified

## 2020-11-01 NOTE — Addendum Note (Signed)
Addended by: Marlowe Shores on: 11/01/2020 11:44 AM   Modules accepted: Orders

## 2020-11-01 NOTE — Addendum Note (Signed)
Addended by: Annalee Genta on: 11/01/2020 11:57 AM   Modules accepted: Orders

## 2020-11-01 NOTE — Telephone Encounter (Signed)
Pt contacted. Pt states she gets the Wellbutrin filled monthly and has not missed a dose. Pt has 7 tablets left. Protonix sent in. Please advise. Thank you

## 2020-11-12 ENCOUNTER — Encounter: Payer: Self-pay | Admitting: Family Medicine

## 2020-11-12 ENCOUNTER — Ambulatory Visit (INDEPENDENT_AMBULATORY_CARE_PROVIDER_SITE_OTHER): Payer: BC Managed Care – PPO | Admitting: Family Medicine

## 2020-11-12 ENCOUNTER — Other Ambulatory Visit: Payer: Self-pay

## 2020-11-12 VITALS — BP 122/76 | HR 94 | Temp 97.4°F | Ht 63.0 in | Wt 178.0 lb

## 2020-11-12 DIAGNOSIS — Z23 Encounter for immunization: Secondary | ICD-10-CM | POA: Diagnosis not present

## 2020-11-12 DIAGNOSIS — G43819 Other migraine, intractable, without status migrainosus: Secondary | ICD-10-CM

## 2020-11-12 DIAGNOSIS — Z0000 Encounter for general adult medical examination without abnormal findings: Secondary | ICD-10-CM | POA: Diagnosis not present

## 2020-11-12 DIAGNOSIS — K219 Gastro-esophageal reflux disease without esophagitis: Secondary | ICD-10-CM

## 2020-11-12 DIAGNOSIS — F902 Attention-deficit hyperactivity disorder, combined type: Secondary | ICD-10-CM

## 2020-11-12 DIAGNOSIS — Z79899 Other long term (current) drug therapy: Secondary | ICD-10-CM | POA: Diagnosis not present

## 2020-11-12 DIAGNOSIS — F321 Major depressive disorder, single episode, moderate: Secondary | ICD-10-CM

## 2020-11-12 MED ORDER — BUPROPION HCL ER (XL) 300 MG PO TB24: 300.0000 mg | ORAL_TABLET | Freq: Every day | ORAL | 5 refills | Status: DC

## 2020-11-12 MED ORDER — AMPHETAMINE-DEXTROAMPHET ER 20 MG PO CP24: 20.0000 mg | ORAL_CAPSULE | ORAL | 0 refills | Status: DC

## 2020-11-12 MED ORDER — RIZATRIPTAN BENZOATE 10 MG PO TABS: ORAL_TABLET | ORAL | 1 refills | Status: DC

## 2020-11-12 NOTE — Progress Notes (Signed)
Patient ID: [name], female    DOB: 01-30-1966, 54 y.o.   MRN: [id number]   Chief Complaint  Patient presents with  . ADD   Subjective:    HPI  med check up, f/u ADHD  ADD - takes adderall xr.   Concerns about being sensitive to smells.   Reflux. Had to start taking 2 protonix instead of one.   Would like a flu vaccine.  Pt sensitive to smells.  Noticing smells that triggers her migraines. Getting a sore throat or headache.  Pt was taking xyal.  Then switched it zyrtec.  Pt had sinus infection 11/21 and was treated with abx and said is improving.  Pt has been taking medication since 2014 for ADD. May have been before that.  Not dx as a child with add/adhd. Not able to stay on task or focusing. Not good to read/remembering.  Pt is taking Adderall 49m xr daily, not taking 456mXR, even though that was how it was prescribed.  Not taking it in summer. Since not working. occ having stress and palpitations, so was only taking it 1 tab per day due to palpitations and elevated Hrs, in 9/21.  Pt stating since then it has resolved.  Has had cardiac cath and stress testing and has h/o SVT, in the past. Cardiology has okayed her being on this medication.  HR was over 100 a few times in 9/21. Cards did recommend a medication but pt not taking it. Feeling more like fluttering feeling when stressed at work.  gerd- Has been taking protonix, and eating tums and protonix. Does not eat a bland diet.  Has inc to 2 per day. Feeling it was refluxing up into esophagus.  Pt stating went back to 1 tab daily. Had egd in past. Had reflux last year with sore throat, from GI.   Migraines- get them occ. With strong smells.  Maxalt.  About 1x per month. Feeling this is controlling her migraines.  Medical History [name] a past medical history of ADD (attention deficit disorder), Depression, Hyperlipidemia, Migraine headache, Plantar fasciitis, and Reflux.   Outpatient  Encounter Medications as of 11/12/2020  Medication Sig  . albuterol (PROVENTIL HFA;VENTOLIN HFA) 108 (90 Base) MCG/ACT inhaler Inhale 2 puffs into the lungs every 6 (six) hours as needed for wheezing.  . Marland Kitchenmphetamine-dextroamphetamine (ADDERALL XR) 20 MG 24 hr capsule Take 2 capsules (40 mg total) by mouth every morning.  . Glucosamine-Chondroitin (GLUCOSAMINE CHONDR COMPLEX PO) Take by mouth daily.  . Multiple Vitamin (MULTIVITAMIN) tablet Take 1 tablet by mouth daily.  . ondansetron (ZOFRAN ODT) 4 MG disintegrating tablet Take 1 tablet (4 mg total) by mouth every 8 (eight) hours as needed for nausea or vomiting.  . pantoprazole (PROTONIX) 40 MG tablet TAKE (1) TABLET BY MOUTH ONCE DAILY.  . valACYclovir (VALTREX) 1000 MG tablet Take 1 tablet (1,000 mg total) by mouth 3 (three) times daily.  . [DISCONTINUED] amphetamine-dextroamphetamine (ADDERALL XR) 20 MG 24 hr capsule Take 2 capsules (40 mg total) by mouth every morning.  . [DISCONTINUED] amphetamine-dextroamphetamine (ADDERALL XR) 20 MG 24 hr capsule Take 2 capsules (40 mg total) by mouth every morning.  . [DISCONTINUED] amphetamine-dextroamphetamine (ADDERALL XR) 20 MG 24 hr capsule Take 2 capsules by mouth every morning  . [DISCONTINUED] amphetamine-dextroamphetamine (ADDERALL XR) 20 MG 24 hr capsule Take 2 capsules (40 mg total) by mouth every morning.  . [DISCONTINUED] azithromycin (ZITHROMAX) 250 MG tablet Take 2 tablets today, then one tablet for 4 days  by mouth  . [DISCONTINUED] buPROPion (WELLBUTRIN XL) 300 MG 24 hr tablet Take 1 tablet (300 mg total) by mouth daily.  . [DISCONTINUED] rizatriptan (MAXALT) 10 MG tablet TAKE 1 TAB AT ONSET OF MIGRAINE. MAY REPEAT IN 2 HOURS UP TO 3/DAY. LIMIT 2 DAYS/WEEK.  Marland Kitchen amphetamine-dextroamphetamine (ADDERALL XR) 20 MG 24 hr capsule Take 1 capsule (20 mg total) by mouth every morning.  Marland Kitchen amphetamine-dextroamphetamine (ADDERALL XR) 20 MG 24 hr capsule Take 1 capsule (20 mg total) by mouth every  morning.  Marland Kitchen amphetamine-dextroamphetamine (ADDERALL XR) 20 MG 24 hr capsule Take 1 capsule (20 mg total) by mouth every morning.  Marland Kitchen buPROPion (WELLBUTRIN XL) 300 MG 24 hr tablet Take 1 tablet (300 mg total) by mouth daily.  . rizatriptan (MAXALT) 10 MG tablet TAKE 1 TAB AT ONSET OF MIGRAINE. MAY REPEAT IN 2 HOURS UP TO 3/DAY. LIMIT 2 DAYS/WEEK.   No facility-administered encounter medications on file as of 11/12/2020.     Review of Systems  Constitutional: Negative for chills and fever.  HENT: Negative for congestion, rhinorrhea and sore throat.   Respiratory: Negative for cough, shortness of breath and wheezing.   Cardiovascular: Positive for palpitations (intermittent). Negative for chest pain and leg swelling.  Gastrointestinal: Negative for abdominal pain, diarrhea, nausea and vomiting.  Genitourinary: Negative for dysuria and frequency.  Musculoskeletal: Negative for arthralgias and back pain.  Skin: Negative for rash.  Neurological: Positive for headaches (controlled with meds). Negative for dizziness and weakness.  Psychiatric/Behavioral: Positive for decreased concentration (controlled with meds). Negative for dysphoric mood, self-injury, sleep disturbance and suicidal ideas. The patient is not nervous/anxious.      Vitals BP 122/76   Pulse 94   Temp (!) 97.4 F (36.3 C)   Ht 5' 3" (1.6 m)   Wt 178 lb (80.7 kg)   SpO2 96%   BMI 31.53 kg/m   Objective:   Physical Exam Vitals and nursing note reviewed.  Constitutional:      Appearance: Normal appearance.  HENT:     Head: Normocephalic and atraumatic.     Nose: Nose normal.     Mouth/Throat:     Mouth: Mucous membranes are moist.     Pharynx: Oropharynx is clear.  Eyes:     Extraocular Movements: Extraocular movements intact.     Conjunctiva/sclera: Conjunctivae normal.     Pupils: Pupils are equal, round, and reactive to light.  Cardiovascular:     Rate and Rhythm: Normal rate and regular rhythm.     Pulses:  Normal pulses.     Heart sounds: Normal heart sounds.  Pulmonary:     Effort: Pulmonary effort is normal.     Breath sounds: Normal breath sounds. No wheezing, rhonchi or rales.  Musculoskeletal:        General: Normal range of motion.     Right lower leg: No edema.     Left lower leg: No edema.  Skin:    General: Skin is warm and dry.     Findings: No lesion or rash.  Neurological:     General: No focal deficit present.     Mental Status: She is alert and oriented to person, place, and time.  Psychiatric:        Mood and Affect: Mood normal.        Behavior: Behavior normal.      Assessment and Plan   1. Attention deficit hyperactivity disorder (ADHD), combined type - amphetamine-dextroamphetamine (ADDERALL XR) 20 MG 24 hr capsule;  Take 1 capsule (20 mg total) by mouth every morning.  Dispense: 30 capsule; Refill: 0 - amphetamine-dextroamphetamine (ADDERALL XR) 20 MG 24 hr capsule; Take 1 capsule (20 mg total) by mouth every morning.  Dispense: 30 capsule; Refill: 0 - amphetamine-dextroamphetamine (ADDERALL XR) 20 MG 24 hr capsule; Take 1 capsule (20 mg total) by mouth every morning.  Dispense: 30 capsule; Refill: 0  2. Laboratory tests ordered as part of a complete physical exam (CPE) - CBC - CMP14+EGFR - Lipid panel - TSH  3. Need for vaccination - Flu Vaccine QUAD 6+ mos PF IM (Fluarix Quad PF)  4. High risk medication use - ToxASSURE Select 13 (MW), Urine  5. Gastroesophageal reflux disease without esophagitis  6. Other migraine without status migrainosus, intractable  7. Current moderate episode of major depressive disorder without prior episode (HCC) - buPROPion (WELLBUTRIN XL) 300 MG 24 hr tablet; Take 1 tablet (300 mg total) by mouth daily.  Dispense: 30 tablet; Refill: 5   ADD- stable Pt doing well with adhd medication.  Has had occ palpitations a few months ago, but resolves.  Pt will cont to monitor this.  Dosage was 20m adderral xr, but pt noticed the  palpitations and dec to just 261mdaily.  We will cont at the lower dose.  Has seen cardiologist in past and was "okayed" to cont with Adderall.  Ordered UDS and pt signed controlled sub agreement.  Migraine-stable. Cont meds. gerd-stable cont meds. Depression- stable,cont meds.  F/u 105m25mo prn.

## 2020-11-15 ENCOUNTER — Encounter: Payer: Self-pay | Admitting: Family Medicine

## 2020-11-21 LAB — TOXASSURE SELECT 13 (MW), URINE

## 2020-11-30 ENCOUNTER — Other Ambulatory Visit: Payer: Self-pay | Admitting: Family Medicine

## 2020-12-03 ENCOUNTER — Other Ambulatory Visit: Payer: Self-pay | Admitting: Family Medicine

## 2020-12-14 LAB — CMP14+EGFR
ALT: 13 IU/L (ref 0–32)
AST: 16 IU/L (ref 0–40)
Albumin/Globulin Ratio: 2.2 (ref 1.2–2.2)
Albumin: 4.7 g/dL (ref 3.8–4.9)
Alkaline Phosphatase: 89 IU/L (ref 44–121)
BUN/Creatinine Ratio: 12 (ref 9–23)
BUN: 12 mg/dL (ref 6–24)
Bilirubin Total: 0.4 mg/dL (ref 0.0–1.2)
CO2: 24 mmol/L (ref 20–29)
Calcium: 9.8 mg/dL (ref 8.7–10.2)
Chloride: 101 mmol/L (ref 96–106)
Creatinine, Ser: 1.04 mg/dL — ABNORMAL HIGH (ref 0.57–1.00)
GFR calc Af Amer: 69 mL/min/{1.73_m2} (ref >59)
GFR calc non Af Amer: 60 mL/min/{1.73_m2} (ref >59)
Globulin, Total: 2.1 g/dL (ref 1.5–4.5)
Glucose: 95 mg/dL (ref 65–99)
Potassium: 5.2 mmol/L (ref 3.5–5.2)
Sodium: 140 mmol/L (ref 134–144)
Total Protein: 6.8 g/dL (ref 6.0–8.5)

## 2020-12-14 LAB — CBC
Hematocrit: 45.2 % (ref 34.0–46.6)
Hemoglobin: 14.8 g/dL (ref 11.1–15.9)
MCH: 30.7 pg (ref 26.6–33.0)
MCHC: 32.7 g/dL (ref 31.5–35.7)
MCV: 94 fL (ref 79–97)
Platelets: 333 10*3/uL (ref 150–450)
RBC: 4.82 x10E6/uL (ref 3.77–5.28)
RDW: 12.1 % (ref 11.7–15.4)
WBC: 5.4 10*3/uL (ref 3.4–10.8)

## 2020-12-14 LAB — LIPID PANEL
Chol/HDL Ratio: 3.4 ratio (ref 0.0–4.4)
Cholesterol, Total: 233 mg/dL — ABNORMAL HIGH (ref 100–199)
HDL: 69 mg/dL (ref >39)
LDL Chol Calc (NIH): 145 mg/dL — ABNORMAL HIGH (ref 0–99)
Triglycerides: 106 mg/dL (ref 0–149)
VLDL Cholesterol Cal: 19 mg/dL (ref 5–40)

## 2020-12-14 LAB — TSH: TSH: 2.71 u[IU]/mL (ref 0.450–4.500)

## 2020-12-25 ENCOUNTER — Emergency Department (HOSPITAL_COMMUNITY): Payer: BC Managed Care – PPO

## 2020-12-25 ENCOUNTER — Encounter (HOSPITAL_COMMUNITY): Payer: Self-pay | Admitting: *Deleted

## 2020-12-25 ENCOUNTER — Emergency Department (HOSPITAL_COMMUNITY)
Admission: EM | Admit: 2020-12-25 | Discharge: 2020-12-26 | Disposition: A | Discharge status: Home / Self Care | Payer: BC Managed Care – PPO | Attending: Emergency Medicine | Admitting: Emergency Medicine

## 2020-12-25 ENCOUNTER — Other Ambulatory Visit: Payer: Self-pay

## 2020-12-25 DIAGNOSIS — R079 Chest pain, unspecified: Secondary | ICD-10-CM | POA: Diagnosis present

## 2020-12-25 LAB — BASIC METABOLIC PANEL
Anion gap: 7 (ref 5–15)
BUN: 15 mg/dL (ref 6–20)
CO2: 25 mmol/L (ref 22–32)
Calcium: 9.2 mg/dL (ref 8.9–10.3)
Chloride: 107 mmol/L (ref 98–111)
Creatinine, Ser: 0.95 mg/dL (ref 0.44–1.00)
GFR, Estimated: >60 mL/min (ref >60)
Glucose, Bld: 102 mg/dL — ABNORMAL HIGH (ref 70–99)
Potassium: 3.9 mmol/L (ref 3.5–5.1)
Sodium: 139 mmol/L (ref 135–145)

## 2020-12-25 LAB — CBC
HCT: 41.4 % (ref 36.0–46.0)
Hemoglobin: 13.4 g/dL (ref 12.0–15.0)
MCH: 31.6 pg (ref 26.0–34.0)
MCHC: 32.4 g/dL (ref 30.0–36.0)
MCV: 97.6 fL (ref 80.0–100.0)
Platelets: 235 10*3/uL (ref 150–400)
RBC: 4.24 MIL/uL (ref 3.87–5.11)
RDW: 12.6 % (ref 11.5–15.5)
WBC: 6.2 10*3/uL (ref 4.0–10.5)
nRBC: 0 % (ref 0.0–0.2)

## 2020-12-25 LAB — D-DIMER, QUANTITATIVE: D-Dimer, Quant: <0.27 ug/mL-FEU (ref 0.00–0.50)

## 2020-12-25 LAB — TROPONIN I (HIGH SENSITIVITY): Troponin I (High Sensitivity): 3 ng/L (ref <18)

## 2020-12-25 NOTE — ED Triage Notes (Signed)
Pt with mid CP ongoing for weeks but pain moved to left side recently, + SOB, denies any N/V.

## 2020-12-26 LAB — TROPONIN I (HIGH SENSITIVITY): Troponin I (High Sensitivity): 4 ng/L (ref <18)

## 2020-12-26 NOTE — Discharge Instructions (Addendum)
You were evaluated in the Emergency Department and after careful evaluation, we did not find any emergent condition requiring admission or further testing in the hospital.  Your exam/testing today is overall reassuring.  No evidence of heart attack or blood clots.  We recommend follow-up with a cardiologist within the next 2 weeks to discuss the need for outpatient stress testing.  Please return to the Emergency Department if you experience any worsening of your condition.   Thank you for allowing [place] to be a part of your care.

## 2020-12-26 NOTE — ED Provider Notes (Signed)
[facility]-EMERGENCY DEPT [facility] Emergency Department Provider Note MRN:  [id number]  Arrival date & time: 12/26/20     Chief Complaint   Chest Pain   History of Present Illness   [name] is a 55 y.o. year-old female with no pertinent past medical presenting to the ED with chief complaint of chest.  Location: Central chest, more recently with radiation to the neck and shoulder on the left Duration: 1 to 2 months but worse over the weekend Onset: Gradual Timing: Intermittent Description: Sometimes sharp, sometimes a pressure Severity: Has been mild but over the weekend has become moderate Exacerbating/Alleviating Factors: Worse with deep breaths, worse with exertion Associated Symptoms: None Pertinent Negatives: Denies dizziness or diaphoresis, no nausea vomiting, no shortness of breath, no leg pain or swelling   Review of Systems  A complete 10 system review of systems was obtained and all systems are negative except as noted in the HPI and PMH.   Patient's Health History    Past Medical History:  Diagnosis Date  . ADD (attention deficit disorder)   . Depression   . Hyperlipidemia   . Migraine headache   . Plantar fasciitis    Chronic  . Reflux     Past Surgical History:  Procedure Laterality Date  . ABDOMINAL HYSTERECTOMY    . CHOLECYSTECTOMY    . COLONOSCOPY    . HEMORROIDECTOMY    . TONSILLECTOMY      History reviewed. No pertinent family history.  Social History   Socioeconomic History  . Marital status: Married    Spouse name: Not on file  . Number of children: Not on file  . Years of education: Not on file  . Highest education level: Not on file  Occupational History  . Not on file  Tobacco Use  . Smoking status: Never Smoker  . Smokeless tobacco: Never Used  Substance and Sexual Activity  . Alcohol use: Not on file  . Drug use: Not on file  . Sexual activity: Not on file  Other Topics Concern  . Not on file  Social History  Narrative  . Not on file   Social Determinants of Health   Financial Resource Strain: Not on file  Food Insecurity: Not on file  Transportation Needs: Not on file  Physical Activity: Not on file  Stress: Not on file  Social Connections: Not on file  Intimate Partner Violence: Not on file     Physical Exam   Vitals:   12/26/20 0000 12/26/20 0030  BP: 131/76 117/72  Pulse: 65 65  Resp: 16 12  SpO2: 99% 100%    CONSTITUTIONAL: Well-appearing, NAD NEURO:  Alert and oriented x 3, no focal deficits EYES:  eyes equal and reactive ENT/NECK:  no LAD, no JVD CARDIO: Regular rate, well-perfused, normal S1 and S2 PULM:  CTAB no wheezing or rhonchi GI/GU:  normal bowel sounds, non-distended, non-tender MSK/SPINE:  No gross deformities, no edema SKIN:  no rash, atraumatic PSYCH:  Appropriate speech and behavior  *Additional and/or pertinent findings included in MDM below  Diagnostic and Interventional Summary    EKG Interpretation  Date/Time:  Tuesday December 25 2020 20:23:51 EST Ventricular Rate:  83 PR Interval:  144 QRS Duration: 86 QT Interval:  372 QTC Calculation: 437 R Axis:   66 Text Interpretation: Normal sinus rhythm Nonspecific T wave abnormality Abnormal ECG Confirmed by Kennis Carina [phone]) on 12/26/2020 12:00:28 AM      Labs Reviewed  BASIC METABOLIC PANEL - Abnormal; Notable  for the following components:      Result Value   Glucose, Bld 102 (*)    All other components within normal limits  CBC  D-DIMER, QUANTITATIVE (NOT AT [facility])  POC URINE PREG, ED  TROPONIN I (HIGH SENSITIVITY)  TROPONIN I (HIGH SENSITIVITY)    DG Chest Portable 1 View  Final Result      Medications - No data to display   Procedures  /  Critical Care Procedures  ED Course and Medical Decision Making  I have reviewed the triage vital signs, the nursing notes, and pertinent available records from the EMR.  Listed above are laboratory and imaging tests that I personally  ordered, reviewed, and interpreted and then considered in my medical decision making (see below for details).  Chest pain in this 55 year old female with minimal cardiovascular risk factors.  Heart score is 2.  EKG showing some nonspecific T wave findings, no prior for comparison.  Considering ACS versus PE, doubt dissection.  Chest x-ray is reassuring with normal mediastinum.  Troponin is negative x2, D-dimer is negative.  Overall patient is with normal vital signs, no acute distress, appropriate for discharge with cardiology follow-up, strict return precautions.       Elmer Sow. Pilar Plate, MD [facility] Emergency Medicine [facility] [email or web address]  Final Clinical Impressions(s) / ED Diagnoses     ICD-10-CM   1. Chest pain, unspecified type  R07.9     ED Discharge Orders         Ordered    Ambulatory referral to Cardiology        12/26/20 0045           Discharge Instructions Discussed with and Provided to Patient:     Discharge Instructions     You were evaluated in the Emergency Department and after careful evaluation, we did not find any emergent condition requiring admission or further testing in the hospital.  Your exam/testing today is overall reassuring.  No evidence of heart attack or blood clots.  We recommend follow-up with a cardiologist within the next 2 weeks to discuss the need for outpatient stress testing.  Please return to the Emergency Department if you experience any worsening of your condition.   Thank you for allowing [place] to be a part of your care.      Sabas Sous, MD 12/26/20 [phone]

## 2020-12-27 ENCOUNTER — Telehealth: Payer: Self-pay

## 2020-12-27 NOTE — Telephone Encounter (Signed)
Transition Care Management Follow-up Telephone Call  Date of discharge and from where: 12/26/2020 from [facility]  How have you been since you were released from the hospital? Pt states that she is feeling much better.   Any questions or concerns? No  Items Reviewed:  Did the pt receive and understand the discharge instructions provided? Yes   Medications obtained and verified? Yes   Other? No   Any new allergies since your discharge? No   Dietary orders reviewed? N/A  Do you have support at home? Yes   Functional Questionnaire: (I = Independent and D = Dependent) ADLs: I  Bathing/Dressing- I  Meal Prep- I  Eating- I  Maintaining continence- I  Transferring/Ambulation- I  Managing Meds- I   Follow up appointments reviewed:   [facility] f/u appt confirmed? Yes  Scheduled to see Sherie Don, NP on 01/04/2021 @ 2:40pm.  Are transportation arrangements needed? No   If their condition worsens, is the pt aware to call PCP or go to the Emergency Dept.? Yes  Was the patient provided with contact information for the PCP's office or ED? Yes  Was to pt encouraged to call back with questions or concerns? Yes

## 2020-12-28 ENCOUNTER — Ambulatory Visit (INDEPENDENT_AMBULATORY_CARE_PROVIDER_SITE_OTHER): Payer: BC Managed Care – PPO | Admitting: Cardiology

## 2020-12-28 ENCOUNTER — Encounter: Payer: Self-pay | Admitting: Cardiology

## 2020-12-28 ENCOUNTER — Other Ambulatory Visit: Payer: Self-pay

## 2020-12-28 VITALS — BP 118/76 | HR 104 | Ht 63.0 in | Wt 178.0 lb

## 2020-12-28 DIAGNOSIS — R0789 Other chest pain: Secondary | ICD-10-CM

## 2020-12-28 DIAGNOSIS — R079 Chest pain, unspecified: Secondary | ICD-10-CM

## 2020-12-28 NOTE — Progress Notes (Signed)
Clinical Summary [name] is a 55 y.o.female seen today as a new patient for the following medical probles.   1. Chest pain  Trop neg x 2, Ddimer neg. EKG SR, nonspecific ST/T changes  - chest pains started about 2-3 months ago - episode of chest pains. Pain in midback to midback, sharp pain 7-8/10. Worst with deep breathing. Consstant several hours. Saturday pain not as severe, more left sided. Worst with deep breathing. +SOB. Fatigue. Felt better SUnday and Monday, though occasional palpitations.  - Tuesday feeling of chest heaviness, pain radiating into left neck and shoulder. Constant for several hours.   CAD risk factors: 10 year smoking history, maternal grandfather 21s MI, mom MI 8s or 33s.     2. Palpitations/PSVT - long 30+ year history  Past Medical History:  Diagnosis Date  . ADD (attention deficit disorder)   . Depression   . Hyperlipidemia   . Migraine headache   . Plantar fasciitis    Chronic  . Reflux      Allergies  Allergen Reactions  . Hydrocodone-Acetaminophen      Current Outpatient Medications  Medication Sig Dispense Refill  . albuterol (PROVENTIL HFA;VENTOLIN HFA) 108 (90 Base) MCG/ACT inhaler Inhale 2 puffs into the lungs every 6 (six) hours as needed for wheezing. 1 Inhaler 2  . amphetamine-dextroamphetamine (ADDERALL XR) 20 MG 24 hr capsule Take 2 capsules (40 mg total) by mouth every morning. 60 capsule 0  . amphetamine-dextroamphetamine (ADDERALL XR) 20 MG 24 hr capsule Take 1 capsule (20 mg total) by mouth every morning. 30 capsule 0  . amphetamine-dextroamphetamine (ADDERALL XR) 20 MG 24 hr capsule Take 1 capsule (20 mg total) by mouth every morning. 30 capsule 0  . amphetamine-dextroamphetamine (ADDERALL XR) 20 MG 24 hr capsule Take 1 capsule (20 mg total) by mouth every morning. 30 capsule 0  . buPROPion (WELLBUTRIN XL) 300 MG 24 hr tablet Take 1 tablet (300 mg total) by mouth daily. 30 tablet 5  . Glucosamine-Chondroitin  (GLUCOSAMINE CHONDR COMPLEX PO) Take by mouth daily.    . Multiple Vitamin (MULTIVITAMIN) tablet Take 1 tablet by mouth daily.    . ondansetron (ZOFRAN ODT) 4 MG disintegrating tablet Take 1 tablet (4 mg total) by mouth every 8 (eight) hours as needed for nausea or vomiting. 24 tablet 3  . pantoprazole (PROTONIX) 40 MG tablet TAKE (1) TABLET BY MOUTH ONCE DAILY. 30 tablet 0  . rizatriptan (MAXALT) 10 MG tablet TAKE 1 TAB AT ONSET OF MIGRAINE. MAY REPEAT IN 2 HOURS UP TO 3/DAY. LIMIT 2 DAYS/WEEK. 10 tablet 1  . valACYclovir (VALTREX) 1000 MG tablet Take 1 tablet (1,000 mg total) by mouth 3 (three) times daily. 21 tablet 0   No current facility-administered medications for this visit.     Past Surgical History:  Procedure Laterality Date  . ABDOMINAL HYSTERECTOMY    . CHOLECYSTECTOMY    . COLONOSCOPY    . HEMORROIDECTOMY    . TONSILLECTOMY       Allergies  Allergen Reactions  . Hydrocodone-Acetaminophen       No family history on file.   Social History [name] reports that she has never smoked. She has never used smokeless tobacco. [name] has no history on file for alcohol use.   Review of Systems CONSTITUTIONAL: No weight loss, fever, chills, weakness or fatigue.  HEENT: Eyes: No visual loss, blurred vision, double vision or yellow sclerae.No hearing loss, sneezing, congestion, runny nose or sore throat.  SKIN: No rash or itching.  CARDIOVASCULAR: per hpi RESPIRATORY: No shortness of breath, cough or sputum.  GASTROINTESTINAL: No anorexia, nausea, vomiting or diarrhea. No abdominal pain or blood.  GENITOURINARY: No burning on urination, no polyuria NEUROLOGICAL: No headache, dizziness, syncope, paralysis, ataxia, numbness or tingling in the extremities. No change in bowel or bladder control.  MUSCULOSKELETAL: No muscle, back pain, joint pain or stiffness.  LYMPHATICS: No enlarged nodes. No history of splenectomy.  PSYCHIATRIC: No history of depression or  anxiety.  ENDOCRINOLOGIC: No reports of sweating, cold or heat intolerance. No polyuria or polydipsia.  Marland Kitchen   Physical Examination Today's Vitals   12/28/20 1331  BP: 118/76  Pulse: (!) 104  SpO2: 97%  Weight: 178 lb (80.7 kg)  Height: 5\' 3"  (1.6 m)   Body mass index is 31.53 kg/m.  Gen: resting comfortably, no acute distress HEENT: no scleral icterus, pupils equal round and reactive, no palptable cervical adenopathy,  CV: RRR, no /mr/g, no jvd Resp: Clear to auscultation bilaterally GI: abdomen is soft, non-tender, non-distended, normal bowel sounds, no hepatosplenomegaly MSK: extremities are warm, no edema.  Skin: warm, no rash Neuro:  no focal deficits Psych: appropriate affect      Assessment and Plan  1. Chest pain - unclear etiology, we will obtain an exercise nuclear stress test for further evaluation      , M.D.

## 2020-12-28 NOTE — Patient Instructions (Signed)
Medication Instructions:  Your physician recommends that you continue on your current medications as directed. Please refer to the Current Medication list given to you today.  *If you need a refill on your cardiac medications before your next appointment, please call your pharmacy*   Lab Work: None today If you have labs (blood work) drawn today and your tests are completely normal, you will receive your results only by: Marland Kitchen MyChart Message (if you have MyChart) OR . A paper copy in the mail If you have any lab test that is abnormal or we need to change your treatment, we will call you to review the results.   Testing/Procedures: Your physician has requested that you have en exercise stress myoview. For further information please visit [email or web address] Please follow instruction sheet, as given.    Follow-Up: At [facility], you and your health needs are our priority.  As part of our continuing mission to provide you with exceptional heart care, we have created designated Provider Care Teams.  These Care Teams include your primary Cardiologist (physician) and Advanced Practice Providers (APPs -  Physician Assistants and Nurse Practitioners) who all work together to provide you with the care you need, when you need it.  We recommend signing up for the patient portal called "MyChart".  Sign up information is provided on this After Visit Summary.  MyChart is used to connect with patients for Virtual Visits (Telemedicine).  Patients are able to view lab/test results, encounter notes, upcoming appointments, etc.  Non-urgent messages can be sent to your provider as well.   To learn more about what you can do with MyChart, go to [email or web address].    Your next appointment:   1 month(s)  The format for your next appointment:   In Person  Provider:   You will see one of the following Advanced Practice Providers on your designated Care Team:    Randall An, PA-C   Jacolyn Reedy, PA-C       Other Instructions Get COVID Test 3 days prior to test        Thank you for choosing [facility] !

## 2021-01-01 ENCOUNTER — Other Ambulatory Visit: Payer: Self-pay | Admitting: Family Medicine

## 2021-01-02 ENCOUNTER — Other Ambulatory Visit: Payer: Self-pay

## 2021-01-02 ENCOUNTER — Other Ambulatory Visit (HOSPITAL_COMMUNITY)
Admission: RE | Admit: 2021-01-02 | Discharge: 2021-01-02 | Disposition: A | Discharge status: Home / Self Care | Payer: BC Managed Care – PPO | Source: Ambulatory Visit | Attending: Internal Medicine | Admitting: Internal Medicine

## 2021-01-02 DIAGNOSIS — Z01812 Encounter for preprocedural laboratory examination: Secondary | ICD-10-CM | POA: Diagnosis present

## 2021-01-02 DIAGNOSIS — Z20822 Contact with and (suspected) exposure to covid-19: Secondary | ICD-10-CM | POA: Diagnosis not present

## 2021-01-02 LAB — SARS CORONAVIRUS 2 (TAT 6-24 HRS): SARS Coronavirus 2: NEGATIVE

## 2021-01-04 ENCOUNTER — Encounter (HOSPITAL_COMMUNITY)
Admission: RE | Admit: 2021-01-04 | Discharge: 2021-01-04 | Disposition: A | Discharge status: Home / Self Care | Payer: BC Managed Care – PPO | Source: Ambulatory Visit | Attending: Internal Medicine | Admitting: Internal Medicine

## 2021-01-04 ENCOUNTER — Encounter (HOSPITAL_COMMUNITY): Payer: Self-pay

## 2021-01-04 ENCOUNTER — Ambulatory Visit: Payer: BC Managed Care – PPO | Admitting: Nurse Practitioner

## 2021-01-04 ENCOUNTER — Other Ambulatory Visit: Payer: Self-pay

## 2021-01-04 ENCOUNTER — Ambulatory Visit (HOSPITAL_COMMUNITY)
Admission: RE | Admit: 2021-01-04 | Discharge: 2021-01-04 | Disposition: A | Discharge status: Home / Self Care | Payer: BC Managed Care – PPO | Source: Ambulatory Visit | Attending: Internal Medicine | Admitting: Internal Medicine

## 2021-01-04 DIAGNOSIS — R079 Chest pain, unspecified: Secondary | ICD-10-CM

## 2021-01-04 HISTORY — DX: Unspecified asthma, uncomplicated: J45.909

## 2021-01-04 LAB — NM MYOCAR MULTI W/SPECT W/WALL MOTION / EF
Estimated workload: 8.4 METS
Exercise duration (min): 7 min
Exercise duration (sec): 1 s
LV dias vol: 65 mL (ref 46–106)
LV sys vol: 21 mL
MPHR: 164 {beats}/min
Peak HR: 144 {beats}/min
Percent HR: 87 %
RATE: 0.4
RPE: 13
Rest HR: 76 {beats}/min
SDS: 2
SRS: 0
SSS: 2
TID: 1.17

## 2021-01-04 MED ORDER — REGADENOSON 0.4 MG/5ML IV SOLN
INTRAVENOUS | Status: AC
  Filled 2021-01-04: qty 5

## 2021-01-04 MED ORDER — TECHNETIUM TC 99M TETROFOSMIN IV KIT
30.0000 | PACK | Freq: Once | INTRAVENOUS | Status: AC | PRN
  Administered 2021-01-04: 32 via INTRAVENOUS

## 2021-01-04 MED ORDER — TECHNETIUM TC 99M TETROFOSMIN IV KIT
10.0000 | PACK | Freq: Once | INTRAVENOUS | Status: AC | PRN
  Administered 2021-01-04: 10.5 via INTRAVENOUS

## 2021-01-04 MED ORDER — SODIUM CHLORIDE FLUSH 0.9 % IV SOLN
INTRAVENOUS | Status: AC
  Administered 2021-01-04: 10 mL via INTRAVENOUS
  Filled 2021-01-04: qty 10

## 2021-01-11 ENCOUNTER — Ambulatory Visit: Payer: BC Managed Care – PPO | Admitting: Internal Medicine

## 2021-01-25 ENCOUNTER — Other Ambulatory Visit: Payer: Self-pay

## 2021-01-25 ENCOUNTER — Encounter: Payer: Self-pay | Admitting: Nurse Practitioner

## 2021-01-25 ENCOUNTER — Ambulatory Visit: Payer: BC Managed Care – PPO | Admitting: Nurse Practitioner

## 2021-01-25 VITALS — BP 122/82 | HR 84 | Temp 97.4°F | Ht 63.0 in | Wt 179.4 lb

## 2021-01-25 DIAGNOSIS — F902 Attention-deficit hyperactivity disorder, combined type: Secondary | ICD-10-CM | POA: Diagnosis not present

## 2021-01-25 DIAGNOSIS — K219 Gastro-esophageal reflux disease without esophagitis: Secondary | ICD-10-CM | POA: Diagnosis not present

## 2021-01-25 MED ORDER — AMPHETAMINE-DEXTROAMPHET ER 20 MG PO CP24: 20.0000 mg | ORAL_CAPSULE | Freq: Every morning | ORAL | 0 refills | Status: DC

## 2021-01-25 NOTE — Progress Notes (Signed)
Subjective:    Patient ID: [name], female    DOB: 04-23-1966, 55 y.o.   MRN: [id number]  HPI  Patient arrives to discuss ADHD.  Adderall decreased to 20mg  at last visit with Dr. . Pt saw cardiologist in February for chest pain and no abnormal findings were found. 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 States palpitations often starts when feeling overwhelmed with 6am clinic calls. She can feel the anxiety increasing at certain times. Chest pain has completely resolved.   Compliant with medication: Yes; does not take on weekends unless she needs it  Difficulty completing tasks or focusing: This is helping but having some difficulty due to anxiety  Side effects: None  Appetite: No change  Sleep: Some issues but states it is not related to her ADD med  Does have trouble sleeping but attributes that to stress. Has not be able to eat and as well as she would like to. Does not engage in much physical activity because of busy schedule. GERD well controlled with Pantoprazole.   Review of Systems  Constitutional: Negative for fever and weight loss.  Respiratory: Negative for cough, shortness of breath and wheezing.   Cardiovascular: Negative for chest pain, palpitations, orthopnea and leg swelling.  Gastrointestinal: Negative for abdominal pain, constipation, diarrhea, heartburn, nausea and vomiting.  Psychiatric/Behavioral: The patient has insomnia.     Gets PE and screening mammograms through GYN.     Objective:   Physical Exam Constitutional:      General: She is not in acute distress.    Appearance: Normal appearance. She is not ill-appearing.  Cardiovascular:     Rate and Rhythm: Normal rate and regular rhythm.     Pulses: Normal pulses.     Heart sounds: Normal heart sounds. No murmur heard. No gallop.   Pulmonary:     Effort: Pulmonary effort is normal. No respiratory  distress.     Breath sounds: Normal breath sounds. No wheezing.     Comments: Clear and equal in all bases Abdominal:     General: There is no distension.     Palpations: Abdomen is soft. There is no mass.     Tenderness: There is no abdominal tenderness.  Neurological:     Mental Status: She is alert and oriented to person, place, and time.  Psychiatric:        Mood and Affect: Mood normal.        Behavior: Behavior normal.        Thought Content: Thought content normal.        Judgment: Judgment normal.     Today's Vitals   01/25/21 1025  BP: 122/82  Pulse: 84  Temp: (!) 97.4 F (36.3 C)  TempSrc: Oral  SpO2: 99%  Weight: 179 lb 6.4 oz (81.4 kg)  Height: 5\' 3"  (1.6 m)   Body mass index is 31.78 kg/m.       Assessment & Plan:   Problem List Items Addressed This Visit      Digestive   Esophageal reflux     Other   ADHD (attention deficit hyperactivity disorder) - Primary     Meds ordered this encounter  Medications  . DISCONTD: amphetamine-dextroamphetamine (ADDERALL XR) 20 MG 24 hr capsule    Sig: Take 1 capsule (20 mg total) by mouth every morning.    Dispense:  30 capsule    Refill:  0  Order Specific Question:   Supervising Provider    Answer:   Lilyan Punt A [[id number]]  . DISCONTD: amphetamine-dextroamphetamine (ADDERALL XR) 20 MG 24 hr capsule    Sig: Take 1 capsule (20 mg total) by mouth every morning.    Dispense:  30 capsule    Refill:  0    May fill 30 days from 01/25/2021    Order Specific Question:   Supervising Provider    Answer:   Lilyan Punt A [[id number]]  . amphetamine-dextroamphetamine (ADDERALL XR) 20 MG 24 hr capsule    Sig: Take 1 capsule (20 mg total) by mouth every morning.    Dispense:  30 capsule    Refill:  0    May fill 60 days from 01/25/2021    Order Specific Question:   Supervising Provider    Answer:   Lilyan Punt A [[id number]]  . pantoprazole (PROTONIX) 40 MG tablet    Sig: TAKE (1) TABLET BY MOUTH ONCE DAILY PRN ACID REFLUX     Dispense:  30 tablet    Refill:  2    Order Specific Question:   Supervising Provider    Answer:   Babs Sciara [id number]      Plan/ education  Discussed medication to help with sleep, stress and anxiety. Patient declined as this time.  Important to incorporate physical activity when possible.   Continue work on decreasing stressors  Plans to try Melatonin or OTC meds for sleep. Call back if insomnia persists.   Return in about 3 months (around 04/27/2021).

## 2021-01-25 NOTE — Progress Notes (Signed)
   Subjective:    Patient ID: [name], female    DOB: [birth year], 55 y.o.   MRN: [id number]  HPI Patient arrives to discuss ADHD.   Review of Systems     Objective:   Physical Exam        Assessment & Plan:

## 2021-01-26 ENCOUNTER — Other Ambulatory Visit: Payer: Self-pay | Admitting: Family Medicine

## 2021-01-26 ENCOUNTER — Encounter: Payer: Self-pay | Admitting: Nurse Practitioner

## 2021-01-26 MED ORDER — PANTOPRAZOLE SODIUM 40 MG PO TBEC: DELAYED_RELEASE_TABLET | ORAL | 2 refills | Status: DC

## 2021-02-01 ENCOUNTER — Ambulatory Visit: Payer: BC Managed Care – PPO | Admitting: Student

## 2021-04-02 ENCOUNTER — Other Ambulatory Visit: Payer: Self-pay | Admitting: Nurse Practitioner

## 2021-05-10 ENCOUNTER — Ambulatory Visit: Payer: BC Managed Care – PPO | Admitting: Nurse Practitioner

## 2021-05-23 ENCOUNTER — Other Ambulatory Visit: Payer: Self-pay | Admitting: Obstetrics and Gynecology

## 2021-05-23 DIAGNOSIS — R928 Other abnormal and inconclusive findings on diagnostic imaging of breast: Secondary | ICD-10-CM

## 2021-05-28 ENCOUNTER — Other Ambulatory Visit: Payer: Self-pay | Admitting: Family Medicine

## 2021-05-28 ENCOUNTER — Other Ambulatory Visit: Payer: Self-pay | Admitting: Nurse Practitioner

## 2021-05-28 DIAGNOSIS — F321 Major depressive disorder, single episode, moderate: Secondary | ICD-10-CM

## 2021-05-29 NOTE — Telephone Encounter (Signed)
Plan UDS and controlled substance agreement at next visit on 06/28/21

## 2021-06-12 ENCOUNTER — Ambulatory Visit: Payer: BC Managed Care – PPO | Admitting: Nurse Practitioner

## 2021-06-12 ENCOUNTER — Other Ambulatory Visit: Payer: Self-pay | Admitting: Obstetrics and Gynecology

## 2021-06-12 ENCOUNTER — Ambulatory Visit
Admission: RE | Admit: 2021-06-12 | Discharge: 2021-06-12 | Disposition: A | Discharge status: Home / Self Care | Payer: BC Managed Care – PPO | Source: Ambulatory Visit | Attending: Obstetrics and Gynecology | Admitting: Obstetrics and Gynecology

## 2021-06-12 ENCOUNTER — Other Ambulatory Visit: Payer: Self-pay

## 2021-06-12 DIAGNOSIS — R928 Other abnormal and inconclusive findings on diagnostic imaging of breast: Secondary | ICD-10-CM

## 2021-06-28 ENCOUNTER — Ambulatory Visit: Payer: BC Managed Care – PPO | Admitting: Nurse Practitioner

## 2021-06-28 ENCOUNTER — Encounter: Payer: Self-pay | Admitting: Nurse Practitioner

## 2021-06-28 ENCOUNTER — Other Ambulatory Visit: Payer: Self-pay

## 2021-06-28 VITALS — BP 130/92 | HR 87 | Temp 97.9°F | Ht 63.0 in | Wt 182.0 lb

## 2021-06-28 DIAGNOSIS — K219 Gastro-esophageal reflux disease without esophagitis: Secondary | ICD-10-CM

## 2021-06-28 DIAGNOSIS — F902 Attention-deficit hyperactivity disorder, combined type: Secondary | ICD-10-CM

## 2021-06-28 DIAGNOSIS — F418 Other specified anxiety disorders: Secondary | ICD-10-CM

## 2021-06-28 MED ORDER — ONDANSETRON 4 MG PO TBDP
4.0000 mg | ORAL_TABLET | Freq: Three times a day (TID) | ORAL | 5 refills | Status: DC | PRN
Start: 2021-06-28 — End: 2021-10-21

## 2021-06-28 MED ORDER — AMPHETAMINE-DEXTROAMPHET ER 20 MG PO CP24: ORAL_CAPSULE | ORAL | 0 refills | Status: DC

## 2021-06-28 MED ORDER — PANTOPRAZOLE SODIUM 40 MG PO TBEC: DELAYED_RELEASE_TABLET | ORAL | 1 refills | Status: DC

## 2021-06-28 MED ORDER — BUPROPION HCL ER (XL) 300 MG PO TB24: 300.0000 mg | ORAL_TABLET | Freq: Every day | ORAL | 1 refills | Status: DC

## 2021-06-28 NOTE — Progress Notes (Signed)
Subjective:    Patient ID: [name], female    DOB: Jul 19, 1966, 55 y.o.   MRN: [id number]  HPI This patient has adult ADD. Takes medication responsibly. Medication does help the patient focus in be more functional. Patient relates that they are or not abusing the medication or misusing the medication. The patient understands that if they're having any negative side effects such as elevated high blood pressure severe headaches they would need stop the medication follow-up immediately. They also understand that the prescriptions are to last for 3 months then the patient will need to follow-up before having further prescriptions.  Patient compliance yes  Does medication help patient function /attention better  yes  Side effects  no See nursing note above. Patient doing well on Wellbutrin for her depression.  Still struggling with her weight.  Doing well with her diet but due to schedule, limited exercise. Currently on Adderall 20 mg daily which is working well.  Denies any adverse effects. Reflux is completely stable on pantoprazole as long as she takes it daily.  Denies any abdominal pain nausea or vomiting.  Takes a rare Zofran for nausea. Today patient is mostly concerned about 2 reports 1 is a diagnostic mammogram and ultrasound of her breast and the other is results from polyps removed from her recent colonoscopy.  Her mother had breast and colon cancer.  She has had polyps removed more than once.  Her brother and sister have also had polyps removed.  All of them have been benign so far. Depression screen PHQ 2/9 06/28/2021  Decreased Interest 0  Down, Depressed, Hopeless 0  PHQ - 2 Score 0  Altered sleeping -  Tired, decreased energy -  Change in appetite -  Feeling bad or failure about yourself  -  Trouble concentrating -  Moving slowly or fidgety/restless -  Suicidal thoughts -  PHQ-9 Score -  Difficult doing work/chores -        Objective:   Physical Exam NAD.   Alert, oriented.  Mildly anxious affect.  Making good eye contact.  Dressed appropriately.  Thoughts logical coherent and relevant.  Lungs clear.  Heart regular rate and rhythm.  Abdomen soft nondistended nontender.  Today's Vitals   06/28/21 1513  BP: (!) 130/92  Pulse: 87  Temp: 97.9 F (36.6 C)  SpO2: 99%  Weight: 182 lb (82.6 kg)  Height: 5\' 3"  (1.6 m)   Body mass index is 32.24 kg/m.        Assessment & Plan:   Problem List Items Addressed This Visit       Digestive   Esophageal reflux   Relevant Medications   ondansetron (ZOFRAN ODT) 4 MG disintegrating tablet   pantoprazole (PROTONIX) 40 MG tablet     Other   ADHD (attention deficit hyperactivity disorder) - Primary   Depression   Relevant Medications   buPROPion (WELLBUTRIN XL) 300 MG 24 hr tablet   Meds ordered this encounter  Medications   DISCONTD: amphetamine-dextroamphetamine (ADDERALL XR) 20 MG 24 hr capsule    Sig: TAKE (1) CAPSULE BY MOUTH EACH MORNING.    Dispense:  30 capsule    Refill:  0   buPROPion (WELLBUTRIN XL) 300 MG 24 hr tablet    Sig: Take 1 tablet (300 mg total) by mouth daily.    Dispense:  90 tablet    Refill:  1   ondansetron (ZOFRAN ODT) 4 MG disintegrating tablet    Sig: Take 1 tablet (4 mg total)  by mouth every 8 (eight) hours as needed for nausea or vomiting.    Dispense:  24 tablet    Refill:  5   pantoprazole (PROTONIX) 40 MG tablet    Sig: TAKE (1) TABLET BY MOUTH ONCE DAILY AS NEEDED FOR ACID REFLUX.    Dispense:  90 tablet    Refill:  1   DISCONTD: amphetamine-dextroamphetamine (ADDERALL XR) 20 MG 24 hr capsule    Sig: TAKE (1) CAPSULE BY MOUTH EACH MORNING.    Dispense:  30 capsule    Refill:  0    May fill 30 days from 06/28/21    Order Specific Question:   Supervising Provider    Answer:   Lilyan Punt A [[id number]]   amphetamine-dextroamphetamine (ADDERALL XR) 20 MG 24 hr capsule    Sig: TAKE (1) CAPSULE BY MOUTH EACH MORNING.    Dispense:  30 capsule    Refill:  0     May fill 60 days from 06/28/21    Order Specific Question:   Supervising Provider    Answer:   Lilyan Punt A [[id number]]   Continue current medications as directed.  Gets regular preventive health physicals with gynecology. Lengthy discussion regarding her most recent reports.  Her mammogram/ultrasound indicated a benign cyst in the left breast.  Patient plans to follow-up in 6 months.  Defers surgical consultation at this time. Her recent colonoscopy was done through [facility] office.  Indicated benign adenomatous polyps removed per biopsy.  Recommended repeat colonoscopy in 3 years which is standard care. Discussed possible genetic screening considering her family history.  Highly suspect some form of familial adenomatous polyposis.  Patient will consider and get back with [place]. Routine labs were done in January through our office. Return in about 3 months (around 09/28/2021). 30 minutes was spent with the patient including previsit chart review, time spent with patient, discussion of health issues, review of data including medical record, and documentation of the visit.

## 2021-06-29 ENCOUNTER — Encounter: Payer: Self-pay | Admitting: Nurse Practitioner

## 2021-10-19 ENCOUNTER — Telehealth: Payer: BC Managed Care – PPO | Admitting: Nurse Practitioner

## 2021-10-19 DIAGNOSIS — N390 Urinary tract infection, site not specified: Secondary | ICD-10-CM

## 2021-10-19 MED ORDER — SULFAMETHOXAZOLE-TRIMETHOPRIM 800-160 MG PO TABS: 1.0000 | ORAL_TABLET | Freq: Two times a day (BID) | ORAL | 0 refills | Status: DC

## 2021-10-19 NOTE — Progress Notes (Signed)
I have spent 5 minutes in review of e-visit questionnaire, review and updating patient chart, medical decision making and response to patient.  ° °[name] W Chaney Ingram, NP ° °  °

## 2021-10-19 NOTE — Progress Notes (Signed)

## 2021-10-21 ENCOUNTER — Encounter: Payer: Self-pay | Admitting: Family Medicine

## 2021-10-21 ENCOUNTER — Ambulatory Visit: Payer: BC Managed Care – PPO | Admitting: Family Medicine

## 2021-10-21 ENCOUNTER — Other Ambulatory Visit: Payer: Self-pay

## 2021-10-21 DIAGNOSIS — G43719 Chronic migraine without aura, intractable, without status migrainosus: Secondary | ICD-10-CM

## 2021-10-21 DIAGNOSIS — K219 Gastro-esophageal reflux disease without esophagitis: Secondary | ICD-10-CM

## 2021-10-21 DIAGNOSIS — F902 Attention-deficit hyperactivity disorder, combined type: Secondary | ICD-10-CM | POA: Diagnosis not present

## 2021-10-21 DIAGNOSIS — F418 Other specified anxiety disorders: Secondary | ICD-10-CM | POA: Diagnosis not present

## 2021-10-21 DIAGNOSIS — F32A Depression, unspecified: Secondary | ICD-10-CM

## 2021-10-21 MED ORDER — BUPROPION HCL ER (XL) 300 MG PO TB24: 300.0000 mg | ORAL_TABLET | Freq: Every day | ORAL | 3 refills | Status: DC

## 2021-10-21 MED ORDER — ONDANSETRON 4 MG PO TBDP: 4.0000 mg | ORAL_TABLET | Freq: Three times a day (TID) | ORAL | 6 refills | Status: AC | PRN

## 2021-10-21 MED ORDER — PANTOPRAZOLE SODIUM 40 MG PO TBEC: DELAYED_RELEASE_TABLET | ORAL | 3 refills | Status: DC

## 2021-10-21 MED ORDER — AMPHETAMINE-DEXTROAMPHET ER 20 MG PO CP24
20.0000 mg | ORAL_CAPSULE | Freq: Every day | ORAL | 0 refills | Status: DC
Start: 2021-10-21 — End: 2022-01-31

## 2021-10-21 NOTE — Patient Instructions (Signed)
I have refilled your medications.  Call for refill on the Adderall.  Follow up in 6 months.  Take care  Dr. Adriana Simas

## 2021-10-21 NOTE — Assessment & Plan Note (Signed)
Doing well.  Adderall refilled.  I advised patient to send me a refill request after 3 months.  I think we can extend her visits out to 6 months.

## 2021-10-21 NOTE — Assessment & Plan Note (Signed)
Zofran refilled.  Continue PRN Maxalt.

## 2021-10-21 NOTE — Assessment & Plan Note (Signed)
Stable on Protonix.  Continue. 

## 2021-10-21 NOTE — Assessment & Plan Note (Signed)
Stable.  Continue Wellbutrin. 

## 2021-10-21 NOTE — Progress Notes (Signed)
Subjective:  Patient ID: Cindie Crumbly, female    DOB: 1966-07-22  Age: 55 y.o. MRN: [id number]  CC: Chief Complaint  Patient presents with   ADHD    Follow up med refills     HPI:  55 year old female presents for follow-up.  ADHD Patient states that she is doing well on her current dose of Adderall.  She is able to focus and get tasks done.  She is compliant with therapy. She works as a Tax adviser.  Depression She has been doing well on Wellbutrin.  Requesting additional refills today.  GERD Stable on Protonix.  Requesting refills.  Migraine headache No current preventative therapy.  Uses Maxalt as needed.  Patient Active Problem List   Diagnosis Date Noted   Raynaud's disease 03/13/2014   ADHD (attention deficit hyperactivity disorder) 05/12/2013   Esophageal reflux 05/12/2013   Migraine headache 05/12/2013   Depression 05/12/2013    Social Hx   Social History   Socioeconomic History   Marital status: Married    Spouse name: Not on file   Number of children: Not on file   Years of education: Not on file   Highest education level: Not on file  Occupational History   Not on file  Tobacco Use   Smoking status: Never   Smokeless tobacco: Never  Vaping Use   Vaping Use: Never used  Substance and Sexual Activity   Alcohol use: Not on file   Drug use: Not on file   Sexual activity: Yes    Birth control/protection: Surgical  Other Topics Concern   Not on file  Social History Narrative   Not on file   Social Determinants of Health   Financial Resource Strain: Not on file  Food Insecurity: Not on file  Transportation Needs: Not on file  Physical Activity: Not on file  Stress: Not on file  Social Connections: Not on file    Review of Systems  Constitutional: Negative.   Psychiatric/Behavioral:         Patient nervous/anxious about upcoming mammogram.    Objective:  BP 126/83   Pulse 86   Temp (!) 97.1 F (36.2 C)   Ht 5\' 3"  (1.6 m)    Wt 177 lb (80.3 kg)   SpO2 100%   BMI 31.35 kg/m   BP/Weight 10/21/2021 06/28/2021 01/25/2021  Systolic BP 126 130 122  Diastolic BP 83 92 82  Wt. (Lbs) 177 182 179.4  BMI 31.35 32.24 31.78    Physical Exam Vitals and nursing note reviewed.  Constitutional:      General: She is not in acute distress.    Appearance: Normal appearance. She is not ill-appearing.  HENT:     Head: Normocephalic and atraumatic.  Eyes:     General:        Right eye: No discharge.        Left eye: No discharge.     Conjunctiva/sclera: Conjunctivae normal.  Cardiovascular:     Rate and Rhythm: Normal rate and regular rhythm.     Heart sounds: No murmur heard. Pulmonary:     Effort: Pulmonary effort is normal.     Breath sounds: Normal breath sounds. No wheezing, rhonchi or rales.  Neurological:     Mental Status: She is alert.  Psychiatric:        Mood and Affect: Mood normal.        Behavior: Behavior normal.    Lab Results  Component Value Date   WBC 6.2  12/25/2020   HGB 13.4 12/25/2020   HCT 41.4 12/25/2020   PLT 235 12/25/2020   GLUCOSE 102 (H) 12/25/2020   CHOL 233 (H) 12/13/2020   TRIG 106 12/13/2020   HDL 69 12/13/2020   LDLCALC 145 (H) 12/13/2020   ALT 13 12/13/2020   AST 16 12/13/2020   NA 139 12/25/2020   K 3.9 12/25/2020   CL 107 12/25/2020   CREATININE 0.95 12/25/2020   BUN 15 12/25/2020   CO2 25 12/25/2020   TSH 2.710 12/13/2020   HGBA1C 5.2 01/06/2015     Assessment & Plan:   Problem List Items Addressed This Visit       Cardiovascular and Mediastinum   Migraine headache    Zofran refilled.  Continue PRN Maxalt.      Relevant Medications   buPROPion (WELLBUTRIN XL) 300 MG 24 hr tablet     Digestive   Esophageal reflux    Stable on Protonix.  Continue.      Relevant Medications   pantoprazole (PROTONIX) 40 MG tablet   ondansetron (ZOFRAN ODT) 4 MG disintegrating tablet     Other   ADHD (attention deficit hyperactivity disorder)    Doing well.   Adderall refilled.  I advised patient to send me a refill request after 3 months.  I think we can extend her visits out to 6 months.      Depression    Stable.  Continue Wellbutrin.      Relevant Medications   buPROPion (WELLBUTRIN XL) 300 MG 24 hr tablet   Other Visit Diagnoses     Depression with anxiety       Relevant Medications   buPROPion (WELLBUTRIN XL) 300 MG 24 hr tablet       Meds ordered this encounter  Medications   amphetamine-dextroamphetamine (ADDERALL XR) 20 MG 24 hr capsule    Sig: Take 1 capsule (20 mg total) by mouth daily.    Dispense:  90 capsule    Refill:  0   buPROPion (WELLBUTRIN XL) 300 MG 24 hr tablet    Sig: Take 1 tablet (300 mg total) by mouth daily.    Dispense:  90 tablet    Refill:  3   pantoprazole (PROTONIX) 40 MG tablet    Sig: TAKE (1) TABLET BY MOUTH ONCE DAILY AS NEEDED FOR ACID REFLUX.    Dispense:  90 tablet    Refill:  3   ondansetron (ZOFRAN ODT) 4 MG disintegrating tablet    Sig: Take 1 tablet (4 mg total) by mouth every 8 (eight) hours as needed for nausea or vomiting.    Dispense:  30 tablet    Refill:  6    Follow-up:  Return in about 6 months (around 04/20/2022).  Everlene Other DO [facility]

## 2021-12-11 IMAGING — MG MM DIGITAL DIAGNOSTIC UNILAT*L* W/ TOMO W/ CAD
4 series · 4 of 12 positions shown · non-contrast
Comparison: Previous exam(s).

CLINICAL DATA: 54-year-old female recalled from screening mammogram
dated 05/21/2021 for a possible left breast mass.

EXAM:
DIGITAL DIAGNOSTIC UNILATERAL LEFT MAMMOGRAM WITH TOMOSYNTHESIS AND
CAD; ULTRASOUND LEFT BREAST LIMITED
TECHNIQUE: Left digital diagnostic mammography and breast tomosynthesis was
performed. The images were evaluated with computer-aided detection.;
Targeted ultrasound examination of the left breast was performed

[L CC synth-2D]
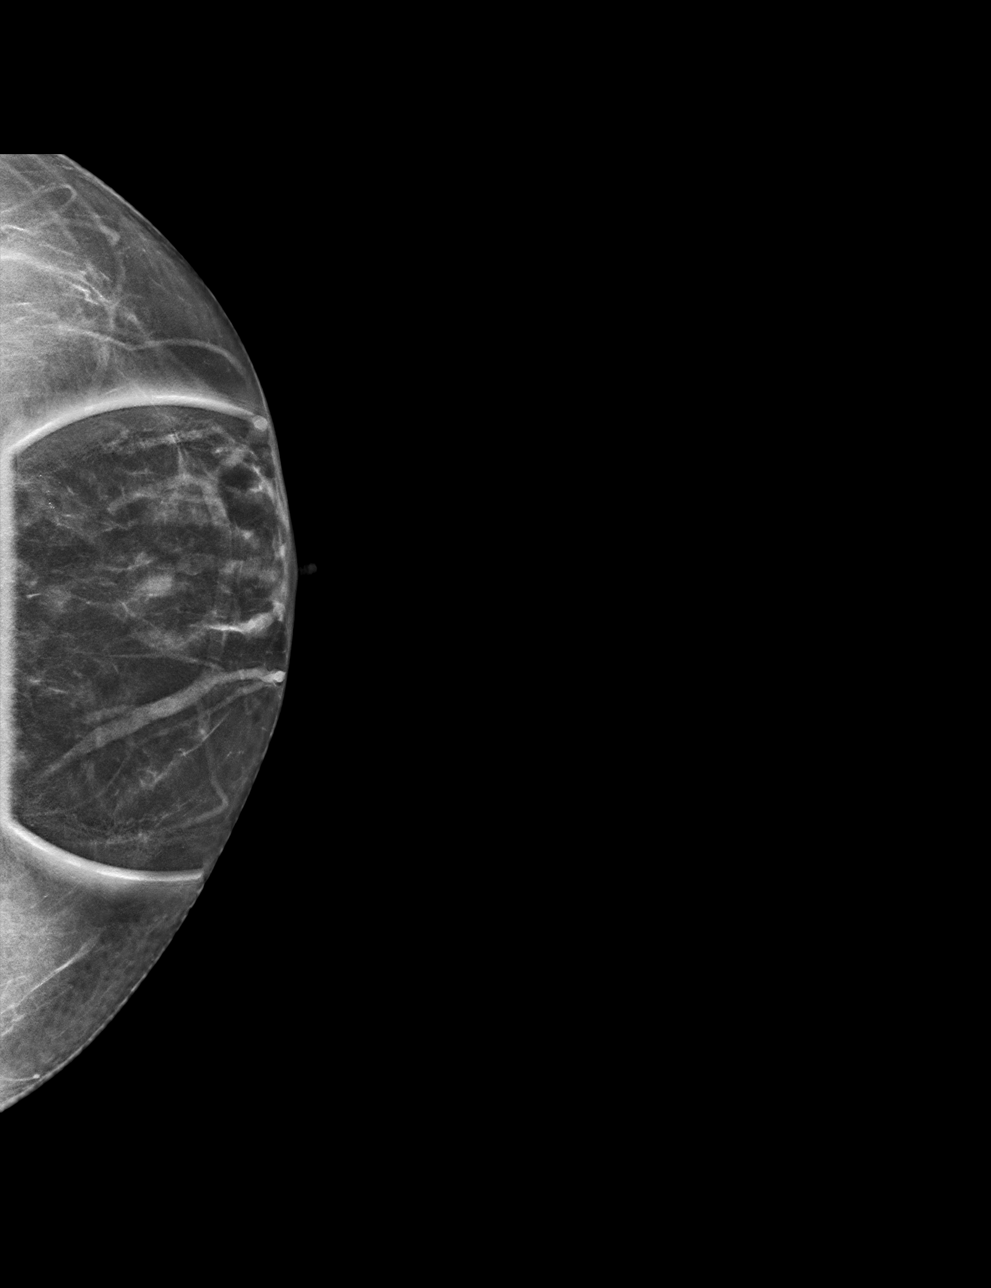

[L MLO synth-2D]
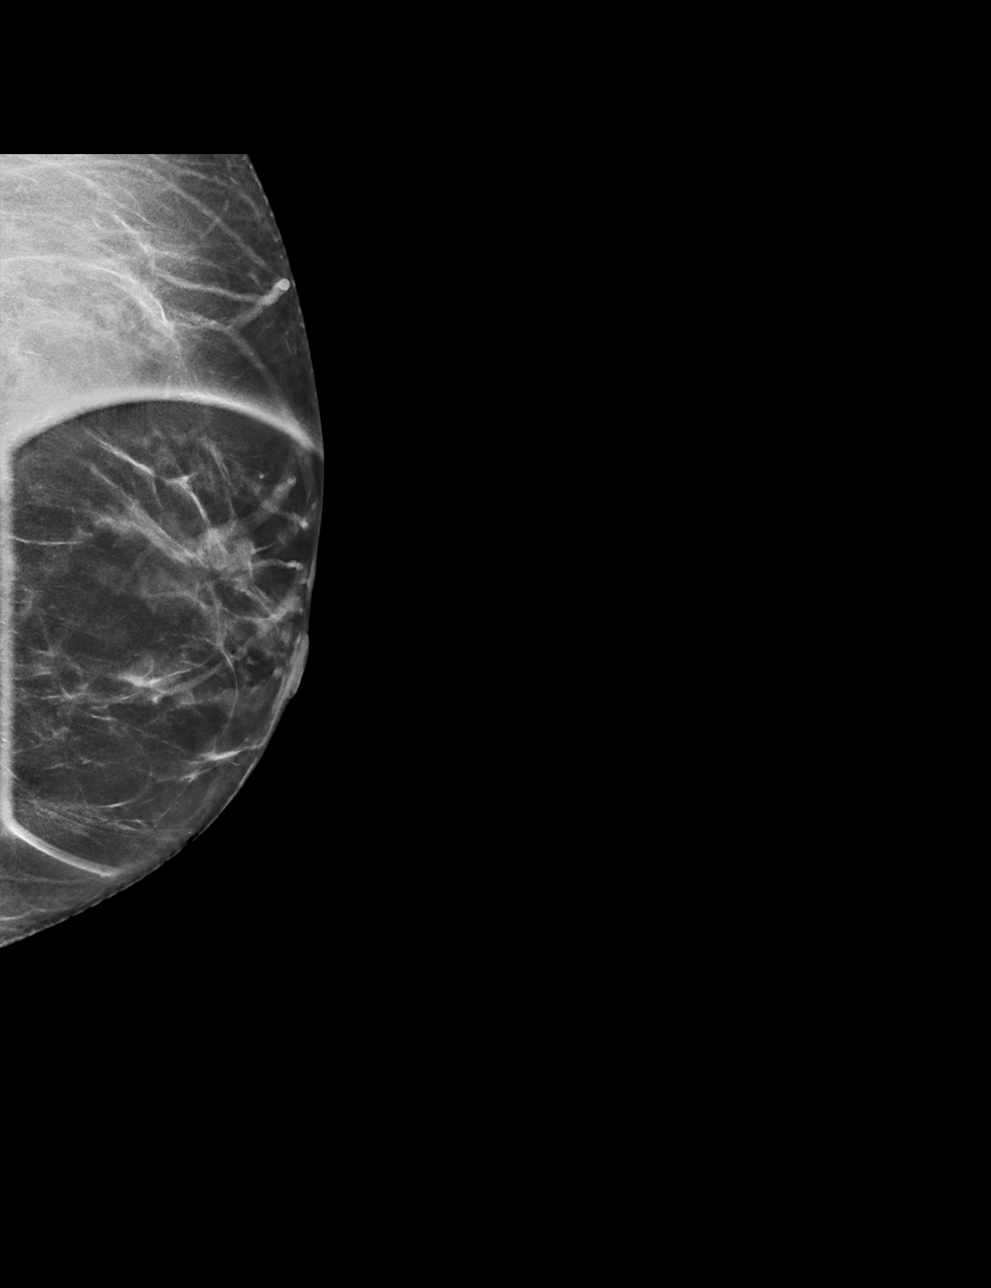

[L CC tomo · tomo slice 25/50.0]
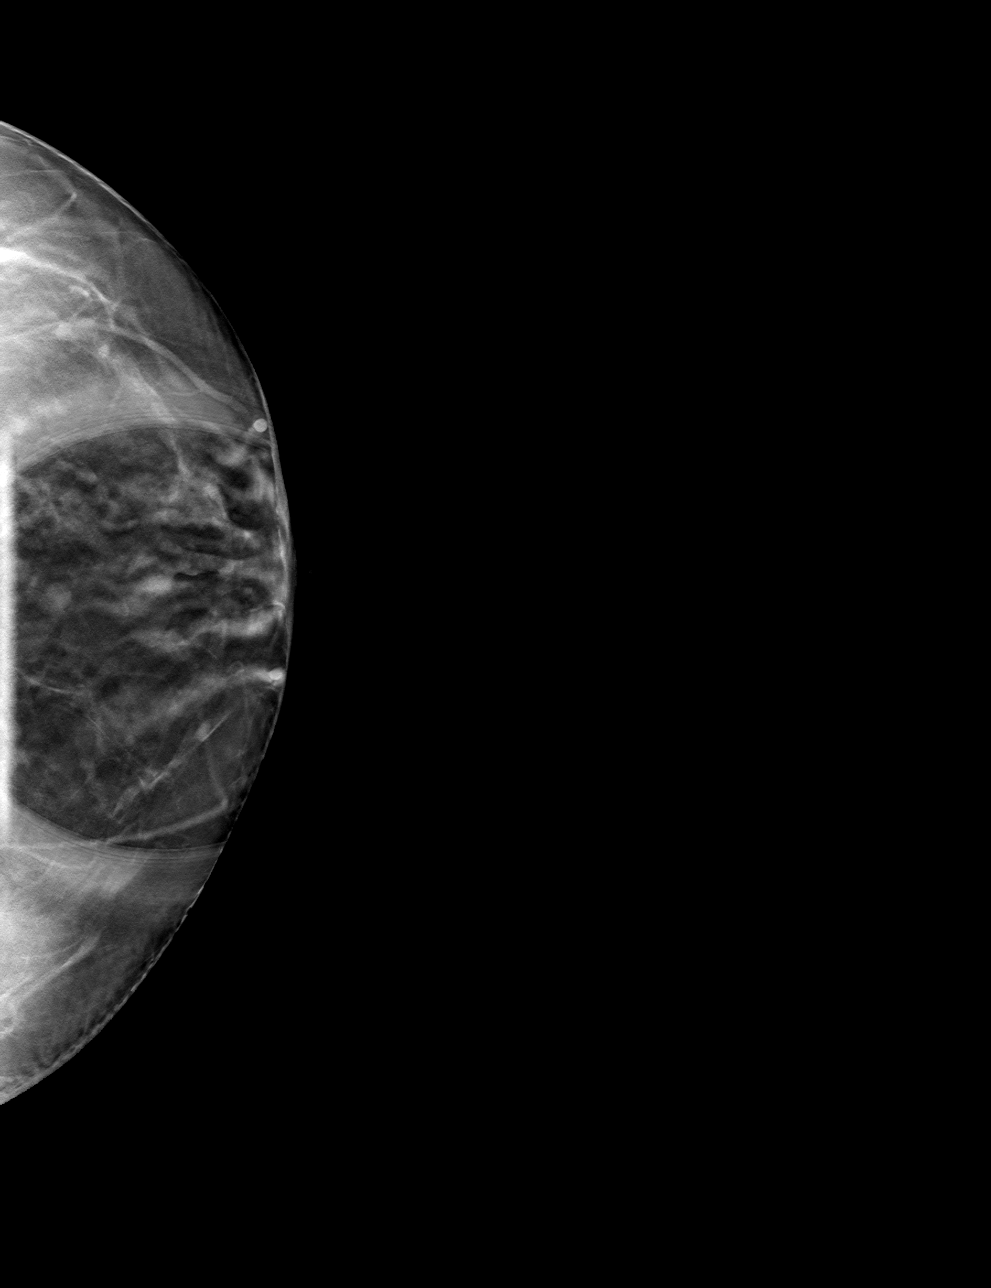

[L MLO tomo · tomo slice 29/56.0]
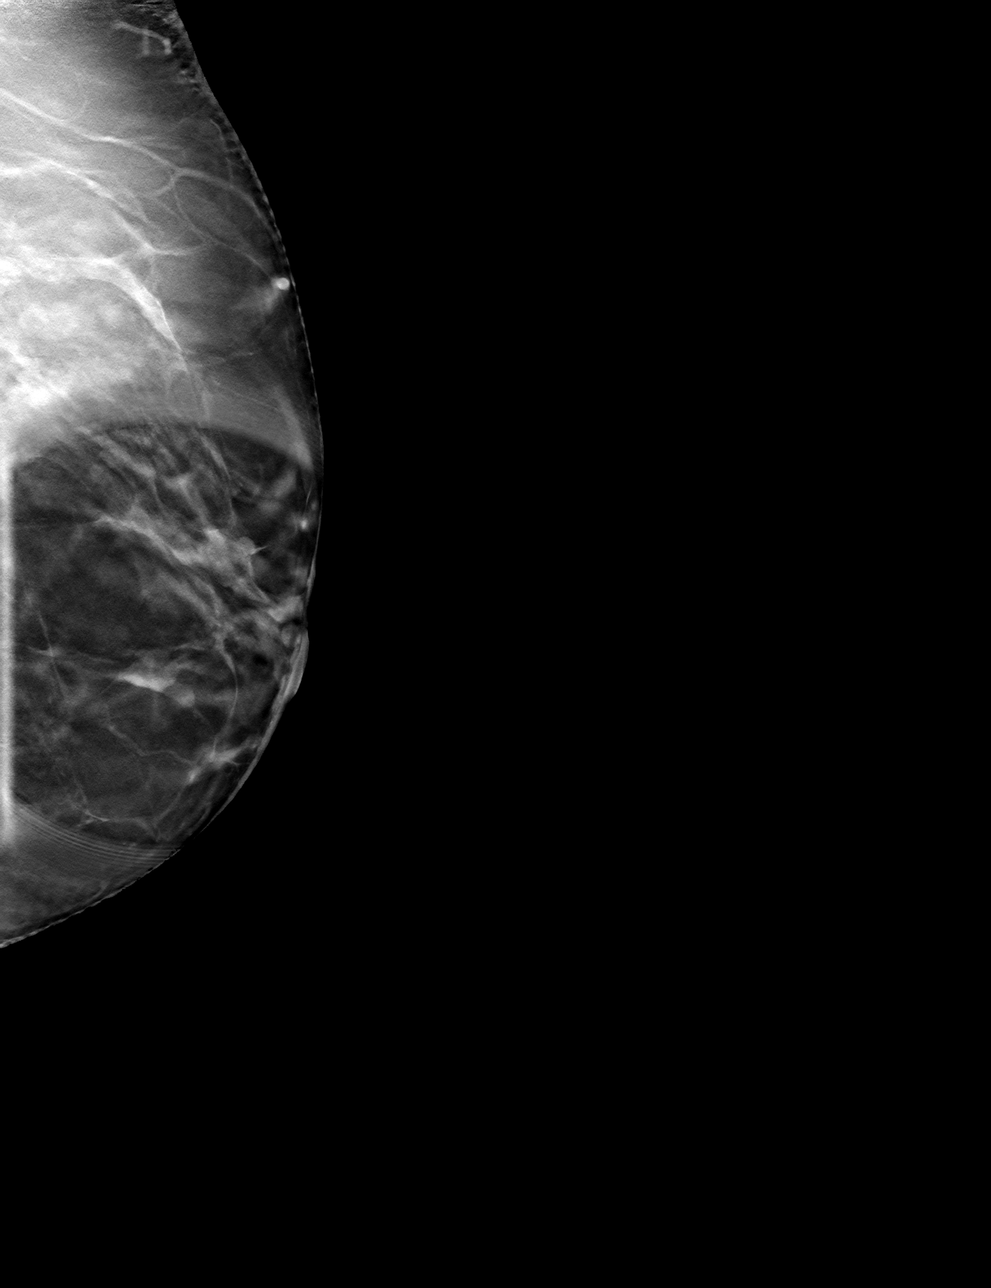

[4 of 12 positions shown; findings below may reference images not displayed]

ACR Breast Density Category b: There are scattered areas of
fibroglandular density.
FINDINGS: There is a persistent round, circumscribed lobulated mass in the
subareolar left breast. Further evaluation with ultrasound was
performed.

Targeted ultrasound is performed, showing an oval, circumscribed
multi-cystic mass at the 12 o'clock retroareolar position. It
measures 7 x 7 x 4 mm. There is no internal vascularity. This
correlates well with the mammographic finding. An additional 3 mm
simple cyst is noted at the [DATE] position 1 cm from the nipple.
IMPRESSION: Probably benign, probable cluster of cysts corresponding with the
screening mammographic finding. Recommendation is for short-term
imaging follow-up.

RECOMMENDATION:
Left breast ultrasound in 6 months.

I have discussed the findings and recommendations with the patient.
If applicable, a reminder letter will be sent to the patient
regarding the next appointment.

BI-RADS CATEGORY  3: Probably benign.

## 2021-12-13 ENCOUNTER — Ambulatory Visit
Admission: RE | Admit: 2021-12-13 | Discharge: 2021-12-13 | Disposition: A | Discharge status: Home / Self Care | Payer: BC Managed Care – PPO | Source: Ambulatory Visit | Attending: Obstetrics and Gynecology | Admitting: Obstetrics and Gynecology

## 2021-12-13 ENCOUNTER — Other Ambulatory Visit: Payer: Self-pay | Admitting: Obstetrics and Gynecology

## 2021-12-13 DIAGNOSIS — R928 Other abnormal and inconclusive findings on diagnostic imaging of breast: Secondary | ICD-10-CM

## 2021-12-13 DIAGNOSIS — N6320 Unspecified lump in the left breast, unspecified quadrant: Secondary | ICD-10-CM

## 2022-01-31 ENCOUNTER — Telehealth: Payer: Self-pay | Admitting: Family Medicine

## 2022-01-31 ENCOUNTER — Other Ambulatory Visit: Payer: Self-pay | Admitting: Family Medicine

## 2022-01-31 MED ORDER — AMPHETAMINE-DEXTROAMPHET ER 20 MG PO CP24: 20.0000 mg | ORAL_CAPSULE | Freq: Every day | ORAL | 0 refills | Status: DC

## 2022-01-31 NOTE — Telephone Encounter (Signed)
Patient requesting a refill she uses [facility]  ? ?amphetamine-dextroamphetamine (ADDERALL XR) 20 MG 24 hr capsule [[id number]]  ?  Order Details ?Dose: 20 mg Route: Oral Frequency: Daily  ?Dispense Quantity: 90 capsule Refills: 0   ?     ?Sig: Take 1 capsule (20 mg total) by mouth daily  ?  ? ?CB# [id number] ?

## 2022-02-12 ENCOUNTER — Ambulatory Visit: Payer: BC Managed Care – PPO | Admitting: Family Medicine

## 2022-02-12 ENCOUNTER — Other Ambulatory Visit: Payer: Self-pay

## 2022-02-12 VITALS — BP 138/86 | HR 98 | Temp 98.2°F | Ht 63.0 in | Wt 182.0 lb

## 2022-02-12 DIAGNOSIS — B029 Zoster without complications: Secondary | ICD-10-CM

## 2022-02-12 MED ORDER — VALACYCLOVIR HCL 1 G PO TABS: 1000.0000 mg | ORAL_TABLET | Freq: Three times a day (TID) | ORAL | 0 refills | Status: DC

## 2022-02-12 NOTE — Progress Notes (Signed)
? ?  Subjective:  ? ? Patient ID: [name], female    DOB: Apr 15, 1966, 56 y.o.   MRN: [id number] ? ?HPI ?Right eye soreness and sensation changes on right side face - feels like a sunburn ?She has had several cases of shingles in the past ?She relates some burning and discomfort on the right side of her face ?Denies any previous troubles ?Denies much in the way of drainage or coughing ? ? ?Review of Systems ? ?   ?Objective:  ? Physical Exam ?Head and neck no masses are felt no blisters are noted patient has soreness to the skin on the right temple region around her eye there is no watering in the eye ophthalmologic exam does not reveal any type of redness drainage or blisters ? ? ? ? ?   ?Assessment & Plan:  ?Patient was warned that if she starts have blisters around her eye or face to immediately contact us we would set her up with ophthalmology ?She was also warned about ophthalmologic effects of shingles ?It is hard to know if this is prodromal effect of shingles or not ?It is reasonable to go ahead and treat with Valtrex 3 times daily for 7 days ?She also needs to complete her Shingrix vaccine ?She is to follow-up if any ongoing troubles she understands our direction ? ?

## 2022-02-16 ENCOUNTER — Encounter: Payer: Self-pay | Admitting: Family Medicine

## 2022-03-03 ENCOUNTER — Telehealth: Payer: BC Managed Care – PPO | Admitting: Physician Assistant

## 2022-03-03 DIAGNOSIS — H1033 Unspecified acute conjunctivitis, bilateral: Secondary | ICD-10-CM

## 2022-03-03 MED ORDER — OFLOXACIN 0.3 % OP SOLN
1.0000 [drp] | Freq: Four times a day (QID) | OPHTHALMIC | 0 refills | Status: DC
Start: 2022-03-03 — End: 2022-05-02

## 2022-03-03 NOTE — Progress Notes (Signed)
E-Visit for Pink Eye ° ° °We are sorry that you are not feeling well.  Here is how we plan to help! ° °Based on what you have shared with me it looks like you have conjunctivitis.  Conjunctivitis is a common inflammatory or infectious condition of the eye that is often referred to as "pink eye".  In most cases it is contagious (viral or bacterial). However, not all conjunctivitis requires antibiotics (ex. Allergic).  We have made appropriate suggestions for you based upon your presentation. ° °I have prescribed Oflaxacin 1-2 drops 4 times a day times 5 days  ° °Pink eye can be highly contagious.  It is typically spread through direct contact with secretions, or contaminated objects or surfaces that one may have touched.  Strict handwashing is suggested with soap and water is urged.  If not available, use alcohol based had sanitizer.  Avoid unnecessary touching of the eye.  If you wear contact lenses, you will need to refrain from wearing them until you see no white discharge from the eye for at least 24 hours after being on medication.  You should see symptom improvement in 1-2 days after starting the medication regimen.  Call us if symptoms are not improved in 1-2 days. ° °Home Care: °Wash your hands often! °Do not wear your contacts until you complete your treatment plan. °Avoid sharing towels, bed linen, personal items with a person who has pink eye. °See attention for anyone in your home with similar symptoms. ° °Get Help Right Away If: °Your symptoms do not improve. °You develop blurred or loss of vision. °Your symptoms worsen (increased discharge, pain or redness) ° ° °Thank you for choosing an e-visit. ° °Your e-visit answers were reviewed by a board certified advanced clinical practitioner to complete your personal care plan. Depending upon the condition, your plan could have included both over the counter or prescription medications. ° °Please review your pharmacy choice. Make sure the pharmacy is open so  you can pick up prescription now. If there is a problem, you may contact your provider through MyChart messaging and have the prescription routed to another pharmacy.  Your safety is important to us. If you have drug allergies check your prescription carefully.  ° °For the next 24 hours you can use MyChart to ask questions about today's visit, request a non-urgent call back, or ask for a work or school excuse. °You will get an email in the next two days asking about your experience. I hope that your e-visit has been valuable and will speed your recovery. ° °I provided 5 minutes of non face-to-face time during this encounter for chart review and documentation.  ° °

## 2022-04-22 ENCOUNTER — Ambulatory Visit: Payer: BC Managed Care – PPO | Admitting: Family Medicine

## 2022-05-02 ENCOUNTER — Ambulatory Visit: Payer: BC Managed Care – PPO | Admitting: Family Medicine

## 2022-05-02 DIAGNOSIS — F902 Attention-deficit hyperactivity disorder, combined type: Secondary | ICD-10-CM | POA: Diagnosis not present

## 2022-05-02 MED ORDER — AMPHETAMINE-DEXTROAMPHET ER 20 MG PO CP24
20.0000 mg | ORAL_CAPSULE | Freq: Every day | ORAL | 0 refills | Status: DC
Start: 2022-05-02 — End: 2022-08-05

## 2022-05-02 NOTE — Progress Notes (Signed)
Subjective:  Patient ID: [name], female    DOB: 21-Jan-1966  Age: 56 y.o. MRN: [id number]  CC: Chief Complaint  Patient presents with   ADHD    Adderall XR Last filled around march 2023, due to shortage     HPI:  56 year old female with migraine, Raynaud's, GERD, ADHD, depression presents for follow-up regarding ADHD.  The patient has been out of her medication for quite some time due to the fact that there have been shortages/back orders.  She is taking Adderall XR 20 mg daily.  It helps her be able to focus.  She tolerates it well.  No adverse side effects.  She would like to discuss getting back on her medication or an alternative if one is not available.  Patient Active Problem List   Diagnosis Date Noted   Raynaud's disease 03/13/2014   ADHD (attention deficit hyperactivity disorder) 05/12/2013   Esophageal reflux 05/12/2013   Migraine headache 05/12/2013   Depression 05/12/2013    Social Hx   Social History   Socioeconomic History   Marital status: Married    Spouse name: Not on file   Number of children: Not on file   Years of education: Not on file   Highest education level: Not on file  Occupational History   Not on file  Tobacco Use   Smoking status: Never   Smokeless tobacco: Never  Vaping Use   Vaping Use: Never used  Substance and Sexual Activity   Alcohol use: Not on file   Drug use: Not on file   Sexual activity: Yes    Birth control/protection: Surgical  Other Topics Concern   Not on file  Social History Narrative   Not on file   Social Determinants of Health   Financial Resource Strain: Not on file  Food Insecurity: Not on file  Transportation Needs: Not on file  Physical Activity: Not on file  Stress: Not on file  Social Connections: Not on file    Review of Systems  Psychiatric/Behavioral:  Positive for decreased concentration.    Objective:  BP 115/79   Pulse (!) 52   Temp 97.8 F (36.6 C)   Ht 5\' 3"  (1.6 m)   Wt  189 lb (85.7 kg)   SpO2 96%   BMI 33.48 kg/m      05/02/2022    1:11 PM 02/12/2022    2:36 PM 10/21/2021   10:46 AM  BP/Weight  Systolic BP 115 138 126  Diastolic BP 79 86 83  Wt. (Lbs) 189 182 177  BMI 33.48 kg/m2 32.24 kg/m2 31.35 kg/m2    Physical Exam Vitals and nursing note reviewed.  Constitutional:      General: She is not in acute distress.    Appearance: Normal appearance. She is not ill-appearing.  HENT:     Head: Normocephalic and atraumatic.  Eyes:     General:        Right eye: No discharge.        Left eye: No discharge.     Conjunctiva/sclera: Conjunctivae normal.  Cardiovascular:     Rate and Rhythm: Normal rate and regular rhythm.  Pulmonary:     Effort: Pulmonary effort is normal.     Breath sounds: Normal breath sounds. No wheezing, rhonchi or rales.  Neurological:     Mental Status: She is alert.  Psychiatric:        Mood and Affect: Mood normal.        Behavior: Behavior normal.  Lab Results  Component Value Date   WBC 6.2 12/25/2020   HGB 13.4 12/25/2020   HCT 41.4 12/25/2020   PLT 235 12/25/2020   GLUCOSE 102 (H) 12/25/2020   CHOL 233 (H) 12/13/2020   TRIG 106 12/13/2020   HDL 69 12/13/2020   LDLCALC 145 (H) 12/13/2020   ALT 13 12/13/2020   AST 16 12/13/2020   NA 139 12/25/2020   K 3.9 12/25/2020   CL 107 12/25/2020   CREATININE 0.95 12/25/2020   BUN 15 12/25/2020   CO2 25 12/25/2020   TSH 2.710 12/13/2020   HGBA1C 5.2 01/06/2015     Assessment & Plan:   Problem List Items Addressed This Visit       Other   ADHD (attention deficit hyperactivity disorder)    One of the local pharmacies has his medication.  I have sent in her medication as requested.       Meds ordered this encounter  Medications   amphetamine-dextroamphetamine (ADDERALL XR) 20 MG 24 hr capsule    Sig: Take 1 capsule (20 mg total) by mouth daily.    Dispense:  90 capsule    Refill:  0   Josselin Gaulin DO [facility]

## 2022-05-02 NOTE — Assessment & Plan Note (Signed)
One of the local pharmacies has his medication.  I have sent in her medication as requested.

## 2022-05-22 ENCOUNTER — Ambulatory Visit
Admission: RE | Admit: 2022-05-22 | Discharge: 2022-05-22 | Disposition: A | Discharge status: Home / Self Care | Payer: BC Managed Care – PPO | Source: Ambulatory Visit | Attending: Obstetrics and Gynecology | Admitting: Obstetrics and Gynecology

## 2022-05-22 DIAGNOSIS — N6320 Unspecified lump in the left breast, unspecified quadrant: Secondary | ICD-10-CM

## 2022-06-13 IMAGING — US US BREAST*L* LIMITED INC AXILLA
1 series · 9 of 9 positions shown · non-contrast
Comparison: Previous exam(s).

CLINICAL DATA: 55-year-old female presenting for first six-month
follow-up of probably benign right breast masses.

EXAM:
ULTRASOUND OF THE RIGHT BREAST

[Series 1: us breast*left* limited inc axilla · 0.06mm/px · 9 of 9 slices shown]
[im 1/9]
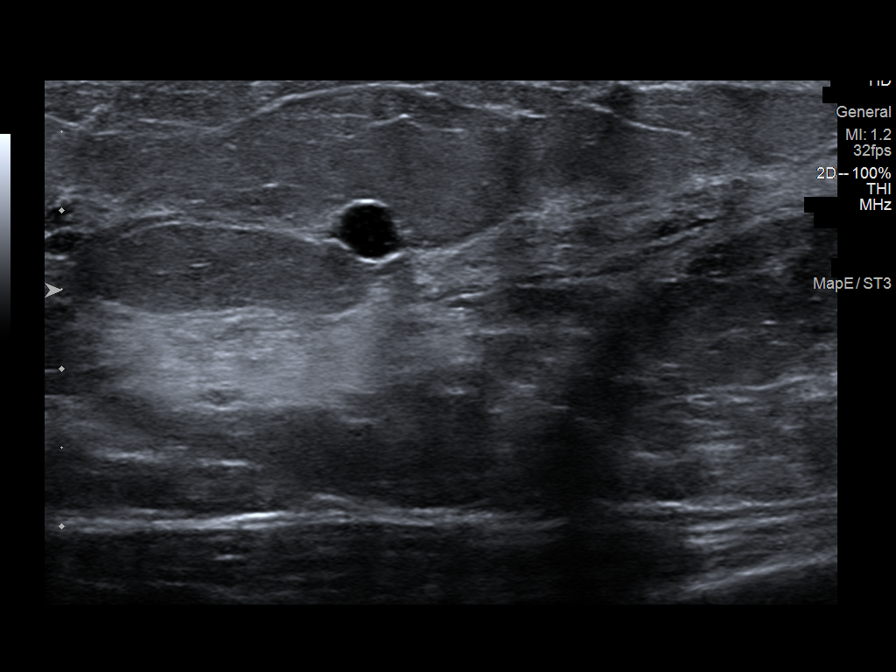
[im 2/9]
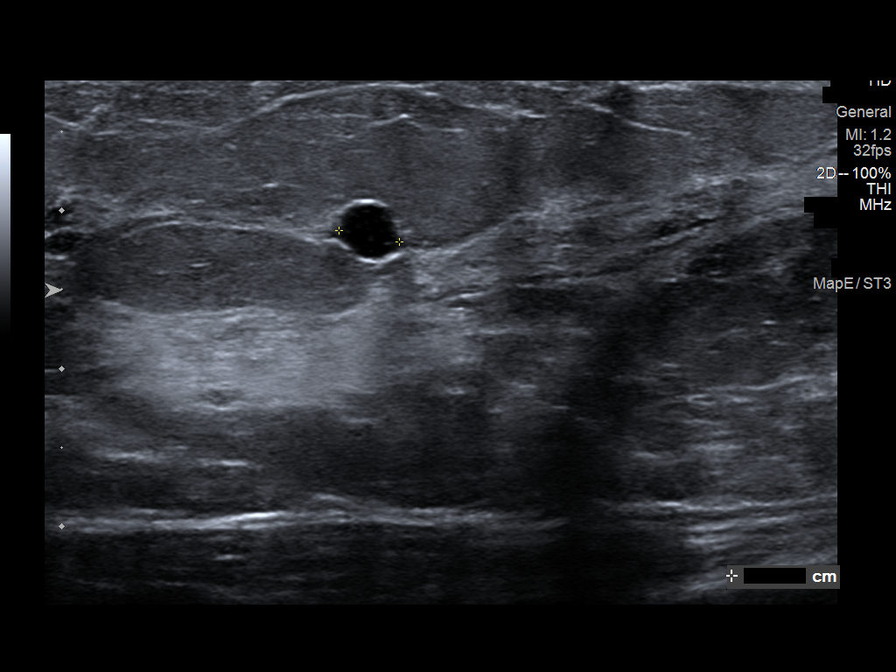
[im 3/9]
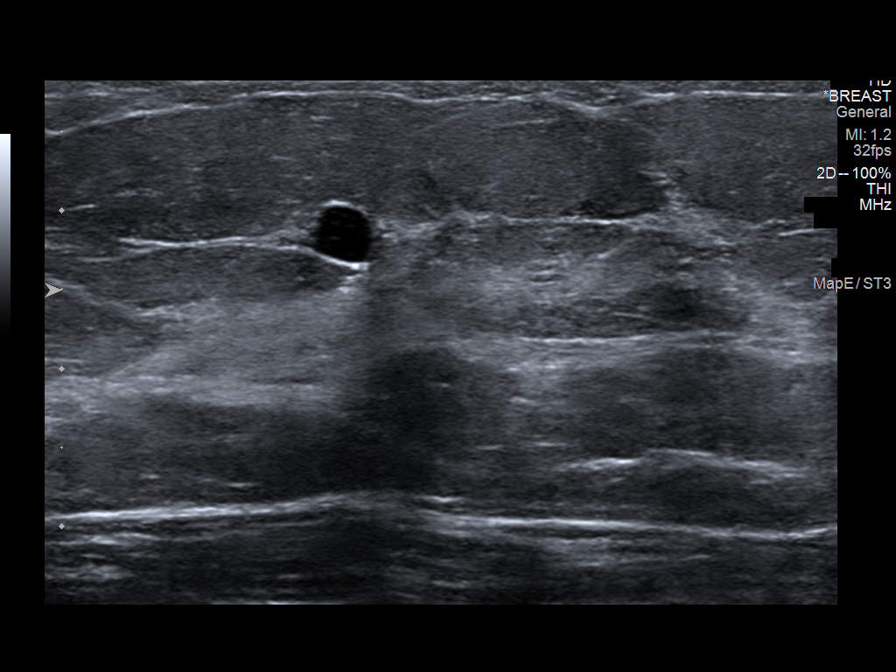
[im 4/9]
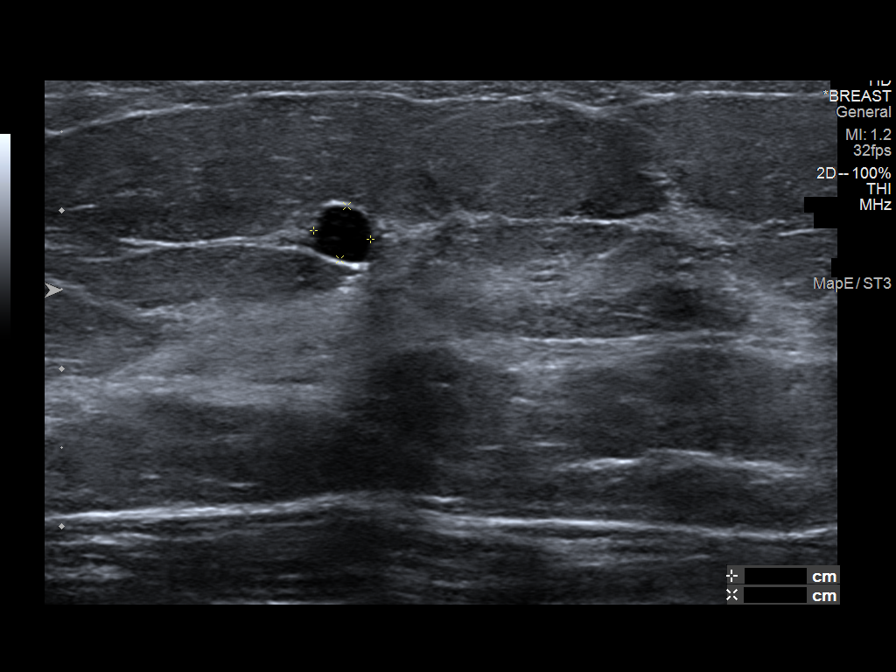
[im 5/9]
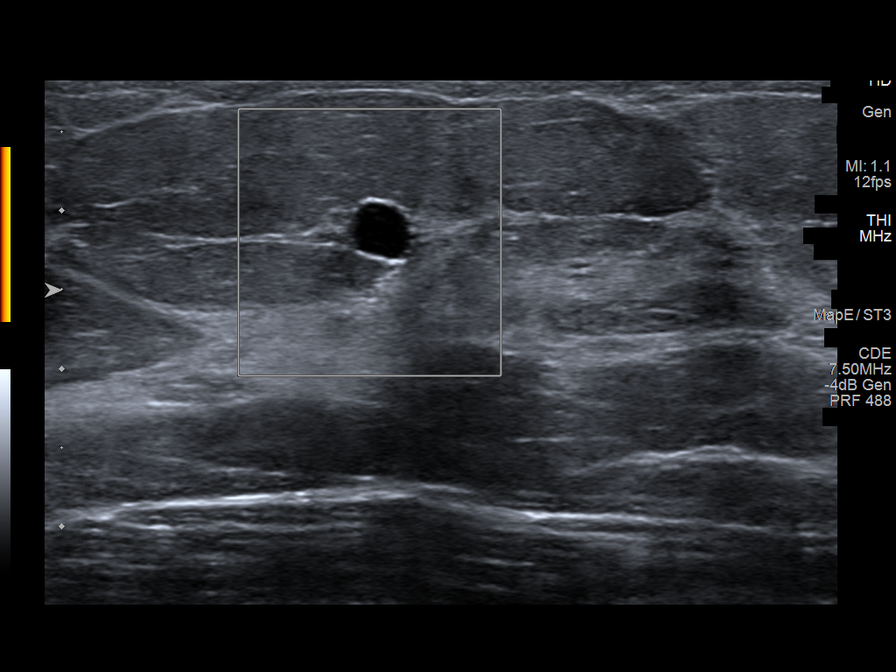
[im 6/9]
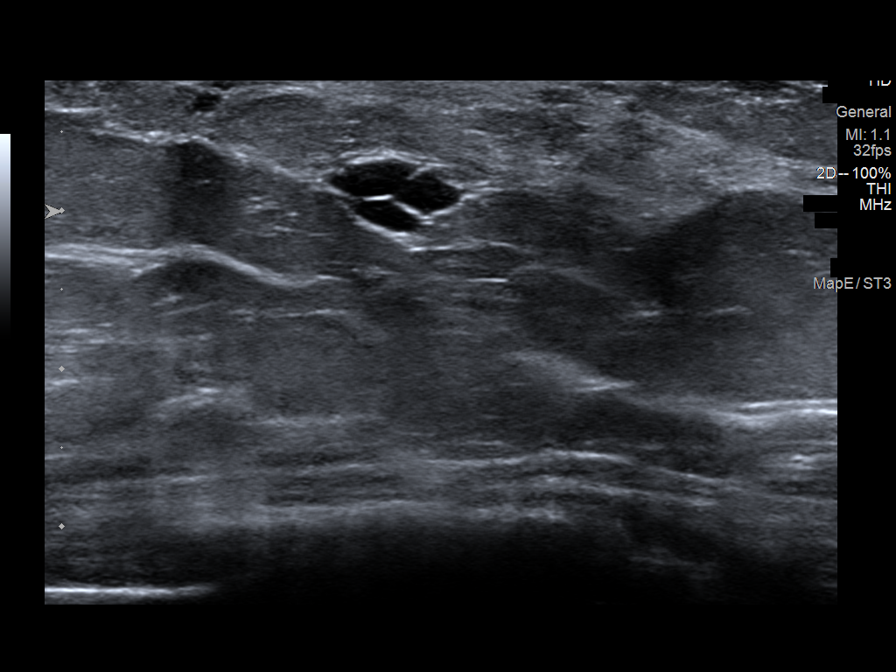
[im 7/9]
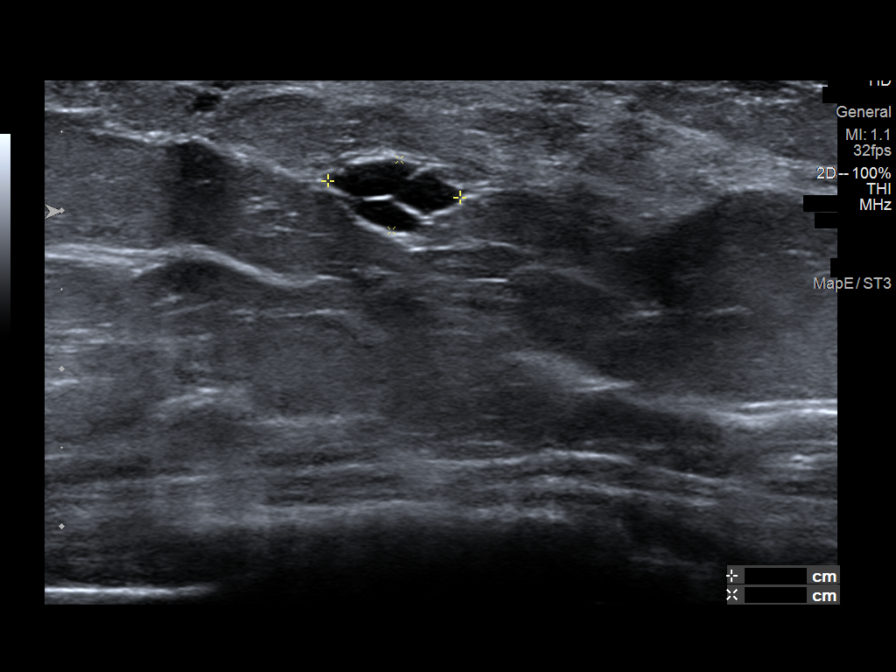
[im 8/9]
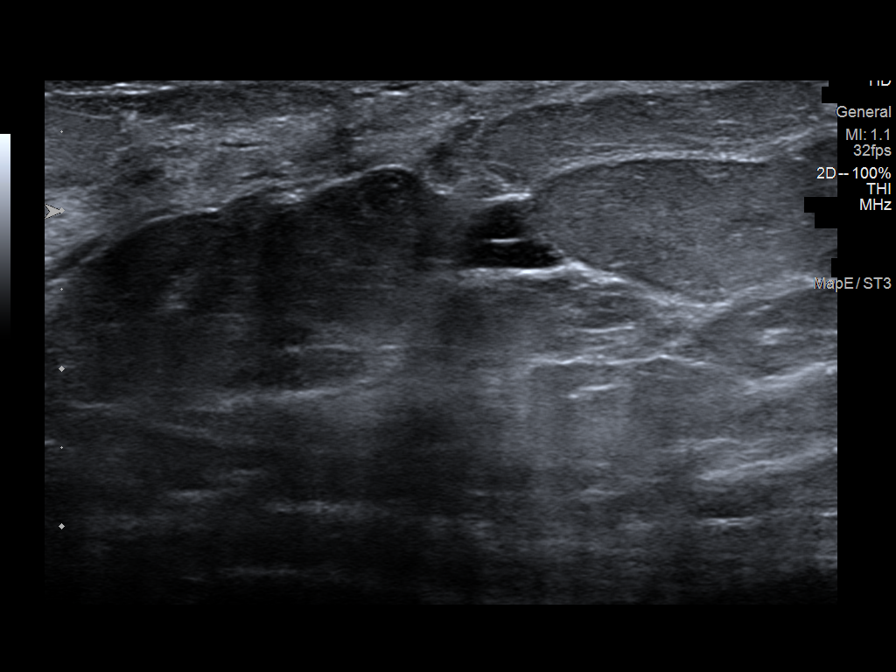
[im 9/9]
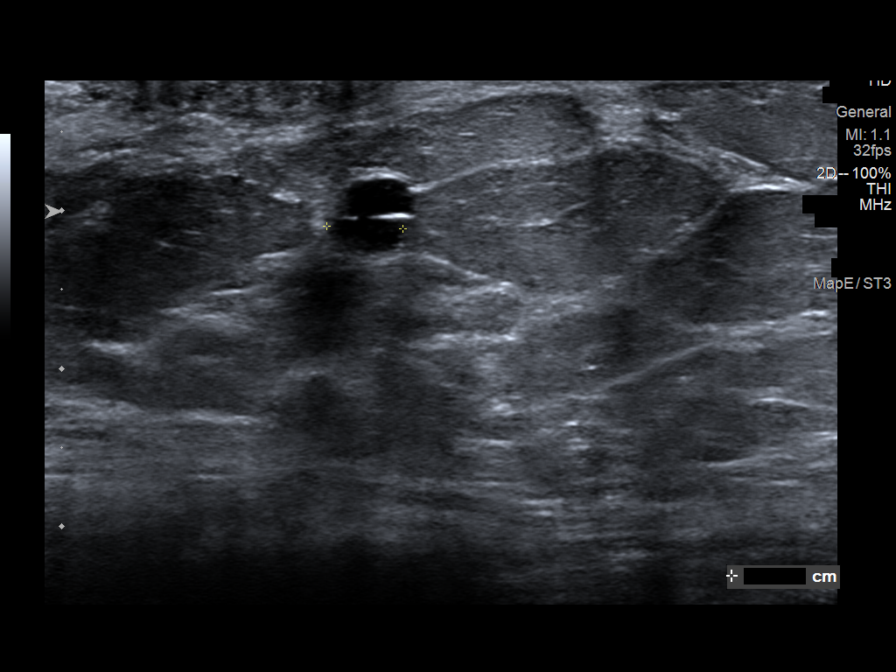

[9 of 9 positions shown; findings below may reference images not displayed]

FINDINGS: Targeted ultrasound is performed, showing stable appearance of an
oval, circumscribed anechoic cyst at the 11 o'clock position 1 cm
from the nipple. It measures 4 x 4 x 3 mm today. A stable cluster of
cysts is demonstrated at the 12 o'clock retroareolar position,
measuring 8 x 5 x 4 mm (7 x 7 x 4 mm). Slight variations are due to
changes in positioning and technique.
IMPRESSION: Stable, probably benign right breast cluster of cysts. Recommend
continued short-term follow-up.

RECOMMENDATION:
Bilateral diagnostic mammogram and left breast ultrasound in 6
months.

I have discussed the findings and recommendations with the patient.
If applicable, a reminder letter will be sent to the patient
regarding the next appointment.

BI-RADS CATEGORY  3: Probably benign.

## 2022-08-01 ENCOUNTER — Other Ambulatory Visit: Payer: Self-pay

## 2022-08-05 ENCOUNTER — Other Ambulatory Visit: Payer: Self-pay | Admitting: Family Medicine

## 2022-08-07 MED ORDER — AMPHETAMINE-DEXTROAMPHET ER 20 MG PO CP24: 20.0000 mg | ORAL_CAPSULE | Freq: Every day | ORAL | 0 refills | Status: DC

## 2022-10-27 ENCOUNTER — Other Ambulatory Visit: Payer: Self-pay | Admitting: Family Medicine

## 2022-10-27 DIAGNOSIS — F418 Other specified anxiety disorders: Secondary | ICD-10-CM

## 2022-11-03 ENCOUNTER — Ambulatory Visit (INDEPENDENT_AMBULATORY_CARE_PROVIDER_SITE_OTHER): Payer: BC Managed Care – PPO | Admitting: Family Medicine

## 2022-11-03 VITALS — BP 100/53 | Ht 63.0 in | Wt 187.2 lb

## 2022-11-03 DIAGNOSIS — Z23 Encounter for immunization: Secondary | ICD-10-CM | POA: Diagnosis not present

## 2022-11-03 DIAGNOSIS — G43009 Migraine without aura, not intractable, without status migrainosus: Secondary | ICD-10-CM

## 2022-11-03 DIAGNOSIS — F902 Attention-deficit hyperactivity disorder, combined type: Secondary | ICD-10-CM

## 2022-11-03 DIAGNOSIS — L989 Disorder of the skin and subcutaneous tissue, unspecified: Secondary | ICD-10-CM | POA: Diagnosis not present

## 2022-11-03 MED ORDER — AMPHETAMINE-DEXTROAMPHET ER 20 MG PO CP24: 20.0000 mg | ORAL_CAPSULE | Freq: Every day | ORAL | 0 refills | Status: DC

## 2022-11-03 NOTE — Assessment & Plan Note (Signed)
Stable - Continue Maxalt as needed

## 2022-11-03 NOTE — Assessment & Plan Note (Signed)
Stable.  Continuing Adderall.  Refill today.

## 2022-11-03 NOTE — Progress Notes (Signed)
Subjective:  Patient ID: [name], female    DOB: [birth year]  Age: 56 y.o. MRN: [id number]  CC: Chief Complaint  Patient presents with   ADHD    Follow up Check spot on right shoulder- seems to change over the last few months    HPI:  56 year old female with migraine, Raynaud's, GERD, ADHD, depression presents for follow-up.  Patient's ADHD medication is working well.  No reported side effects.  Needs refill.  Patient reports that she recently had a skin lesion on the top of her shoulder.  She states that it changed over the past few months and this is concerning for her.  She has difficulty seeing the area due to location.  She would like me to examine this today.  Patient reports that her migraines are fairly well-controlled.  She did have 1 yesterday.  She states that overall she has these 1-2 times every month or so.  Response to Maxalt.  Lastly, patient would like a flu vaccine today.  Patient Active Problem List   Diagnosis Date Noted   Skin lesion 11/03/2022   Raynaud's disease 03/13/2014   ADHD (attention deficit hyperactivity disorder) 05/12/2013   Esophageal reflux 05/12/2013   Migraine headache 05/12/2013   Depression 05/12/2013    Social Hx   Social History   Socioeconomic History   Marital status: Married    Spouse name: Not on file   Number of children: Not on file   Years of education: Not on file   Highest education level: Not on file  Occupational History   Not on file  Tobacco Use   Smoking status: Never   Smokeless tobacco: Never  Vaping Use   Vaping Use: Never used  Substance and Sexual Activity   Alcohol use: Not on file   Drug use: Not on file   Sexual activity: Yes    Birth control/protection: Surgical  Other Topics Concern   Not on file  Social History Narrative   Not on file   Social Determinants of Health   Financial Resource Strain: Not on file  Food Insecurity: Not on file  Transportation Needs: Not on file   Physical Activity: Not on file  Stress: Not on file  Social Connections: Not on file    Review of Systems Per HPI  Objective:  BP (!) 100/53   Ht 5\' 3"  (1.6 m)   Wt 187 lb 3.2 oz (84.9 kg)   BMI 33.16 kg/m      11/03/2022   10:05 AM 05/02/2022    1:11 PM 02/12/2022    2:36 PM  BP/Weight  Systolic BP 100 115 138  Diastolic BP 53 79 86  Wt. (Lbs) 187.2 189 182  BMI 33.16 kg/m2 33.48 kg/m2 32.24 kg/m2    Physical Exam Vitals and nursing note reviewed.  Constitutional:      General: She is not in acute distress.    Appearance: Normal appearance.  HENT:     Head: Normocephalic and atraumatic.  Eyes:     General:        Right eye: No discharge.        Left eye: No discharge.     Conjunctiva/sclera: Conjunctivae normal.  Cardiovascular:     Rate and Rhythm: Normal rate and regular rhythm.  Pulmonary:     Effort: Pulmonary effort is normal.     Breath sounds: Normal breath sounds. No wheezing or rales.  Skin:    Comments: Small slightly raised area with dryness located  just above the supraclavicular fossa.  No color change.  No vascularity.  Benign.  Neurological:     Mental Status: She is alert.     Lab Results  Component Value Date   WBC 6.2 12/25/2020   HGB 13.4 12/25/2020   HCT 41.4 12/25/2020   PLT 235 12/25/2020   GLUCOSE 102 (H) 12/25/2020   CHOL 233 (H) 12/13/2020   TRIG 106 12/13/2020   HDL 69 12/13/2020   LDLCALC 145 (H) 12/13/2020   ALT 13 12/13/2020   AST 16 12/13/2020   NA 139 12/25/2020   K 3.9 12/25/2020   CL 107 12/25/2020   CREATININE 0.95 12/25/2020   BUN 15 12/25/2020   CO2 25 12/25/2020   TSH 2.710 12/13/2020   HGBA1C 5.2 01/06/2015     Assessment & Plan:   Problem List Items Addressed This Visit       Cardiovascular and Mediastinum   Migraine headache    Stable.  Continue Maxalt as needed.        Musculoskeletal and Integument   Skin lesion    Benign.  Continue to monitor.        Other   ADHD (attention deficit  hyperactivity disorder) - Primary    Stable.  Continuing Adderall.  Refill today.      Other Visit Diagnoses     Need for vaccination       Relevant Orders   Flu Vaccine QUAD 53mo+IM (Fluarix, Fluzone & Alfiuria Quad PF) (Completed)       Meds ordered this encounter  Medications   amphetamine-dextroamphetamine (ADDERALL XR) 20 MG 24 hr capsule    Sig: Take 1 capsule (20 mg total) by mouth daily.    Dispense:  90 capsule    Refill:  0    TO [facility]    Follow-up:  Return in about 3 months (around 02/02/2023).  Everlene Other DO [facility]

## 2022-11-03 NOTE — Patient Instructions (Signed)
Continue your medications.  Flu shot given today.  Follow up in 3 months.

## 2022-11-03 NOTE — Assessment & Plan Note (Signed)
Benign.  Continue to monitor.

## 2022-11-12 ENCOUNTER — Other Ambulatory Visit: Payer: Self-pay | Admitting: Family Medicine

## 2022-11-21 ENCOUNTER — Telehealth: Payer: BC Managed Care – PPO | Admitting: Family Medicine

## 2022-11-21 DIAGNOSIS — J019 Acute sinusitis, unspecified: Secondary | ICD-10-CM

## 2022-11-21 DIAGNOSIS — B9689 Other specified bacterial agents as the cause of diseases classified elsewhere: Secondary | ICD-10-CM

## 2022-11-21 MED ORDER — AMOXICILLIN-POT CLAVULANATE 875-125 MG PO TABS: 1.0000 | ORAL_TABLET | Freq: Two times a day (BID) | ORAL | 0 refills | Status: AC

## 2022-11-21 NOTE — Progress Notes (Signed)

## 2022-11-28 ENCOUNTER — Other Ambulatory Visit: Payer: Self-pay | Admitting: Family Medicine

## 2022-11-28 DIAGNOSIS — F418 Other specified anxiety disorders: Secondary | ICD-10-CM

## 2022-12-29 ENCOUNTER — Other Ambulatory Visit: Payer: Self-pay | Admitting: Family Medicine

## 2022-12-29 DIAGNOSIS — F418 Other specified anxiety disorders: Secondary | ICD-10-CM

## 2023-02-03 ENCOUNTER — Ambulatory Visit: Payer: BC Managed Care – PPO | Admitting: Family Medicine

## 2023-02-03 VITALS — BP 125/85 | HR 99 | Ht 63.0 in | Wt 185.8 lb

## 2023-02-03 DIAGNOSIS — F32A Depression, unspecified: Secondary | ICD-10-CM | POA: Diagnosis not present

## 2023-02-03 DIAGNOSIS — F418 Other specified anxiety disorders: Secondary | ICD-10-CM

## 2023-02-03 DIAGNOSIS — F902 Attention-deficit hyperactivity disorder, combined type: Secondary | ICD-10-CM

## 2023-02-03 MED ORDER — AMPHETAMINE-DEXTROAMPHET ER 20 MG PO CP24: 20.0000 mg | ORAL_CAPSULE | Freq: Every day | ORAL | 0 refills | Status: DC

## 2023-02-03 MED ORDER — BUPROPION HCL ER (XL) 300 MG PO TB24: 300.0000 mg | ORAL_TABLET | Freq: Every day | ORAL | 1 refills | Status: DC

## 2023-02-03 NOTE — Assessment & Plan Note (Signed)
Stable. Continue Wellbutrin. Rx refilled today.

## 2023-02-03 NOTE — Patient Instructions (Signed)
Continue your medications.  Follow up in 3 months.  Take care  Dr. Amybeth Sieg  

## 2023-02-03 NOTE — Progress Notes (Signed)
Subjective:  Patient ID: [name], female    DOB: December 20, 1965  Age: 57 y.o. MRN: [id number]  CC: Chief Complaint  Patient presents with   Follow-up    Medication    HPI:  57 year old female presents for follow up regarding ADHD.  Patient reports that her ADD is stable on Adderall.  Needs refill.  No adverse side effects.  No chest pain or shortness of breath.  Good appetite.  Patient also needs refill on her Wellbutrin.  Depression stable at this time.  No other complaints or concerns at this time.  Patient Active Problem List   Diagnosis Date Noted   Skin lesion 11/03/2022   Raynaud's disease 03/13/2014   ADHD (attention deficit hyperactivity disorder) 05/12/2013   Esophageal reflux 05/12/2013   Migraine headache 05/12/2013   Depression 05/12/2013    Social Hx   Social History   Socioeconomic History   Marital status: Married    Spouse name: Not on file   Number of children: Not on file   Years of education: Not on file   Highest education level: Not on file  Occupational History   Not on file  Tobacco Use   Smoking status: Never   Smokeless tobacco: Never  Vaping Use   Vaping Use: Never used  Substance and Sexual Activity   Alcohol use: Not on file   Drug use: Not on file   Sexual activity: Yes    Birth control/protection: Surgical  Other Topics Concern   Not on file  Social History Narrative   Not on file   Social Determinants of Health   Financial Resource Strain: Not on file  Food Insecurity: Not on file  Transportation Needs: Not on file  Physical Activity: Not on file  Stress: Not on file  Social Connections: Not on file    Review of Systems Per HPI  Objective:  BP 125/85   Pulse 99   Ht '5\' 3"'$  (1.6 m)   Wt 185 lb 12.8 oz (84.3 kg)   SpO2 95%   BMI 32.91 kg/m      02/03/2023    1:15 PM 11/03/2022   10:05 AM 05/02/2022    1:11 PM  BP/Weight  Systolic BP [id number]  Diastolic BP 85 53 79  Wt. (Lbs) 185.8 187.2 189  BMI  32.91 kg/m2 33.16 kg/m2 33.48 kg/m2    Physical Exam Vitals and nursing note reviewed.  Constitutional:      General: She is not in acute distress.    Appearance: Normal appearance.  HENT:     Head: Normocephalic and atraumatic.  Eyes:     General:        Right eye: No discharge.        Left eye: No discharge.     Conjunctiva/sclera: Conjunctivae normal.  Cardiovascular:     Rate and Rhythm: Normal rate and regular rhythm.  Pulmonary:     Effort: Pulmonary effort is normal.     Breath sounds: Normal breath sounds. No wheezing, rhonchi or rales.  Neurological:     Mental Status: She is alert.     Lab Results  Component Value Date   WBC 6.2 12/25/2020   HGB 13.4 12/25/2020   HCT 41.4 12/25/2020   PLT 235 12/25/2020   GLUCOSE 102 (H) 12/25/2020   CHOL 233 (H) 12/13/2020   TRIG 106 12/13/2020   HDL 69 12/13/2020   LDLCALC 145 (H) 12/13/2020   ALT 13 12/13/2020   AST 16 12/13/2020  NA 139 12/25/2020   K 3.9 12/25/2020   CL 107 12/25/2020   CREATININE 0.95 12/25/2020   BUN 15 12/25/2020   CO2 25 12/25/2020   TSH 2.710 12/13/2020   HGBA1C 5.2 01/06/2015     Assessment & Plan:   Problem List Items Addressed This Visit       Other   ADHD (attention deficit hyperactivity disorder) - Primary    Stable. Adderall refilled.       Other Visit Diagnoses     Depression with anxiety       Relevant Medications   buPROPion (WELLBUTRIN XL) 300 MG 24 hr tablet       Meds ordered this encounter  Medications   buPROPion (WELLBUTRIN XL) 300 MG 24 hr tablet    Sig: Take 1 tablet (300 mg total) by mouth daily.    Dispense:  90 tablet    Refill:  1   amphetamine-dextroamphetamine (ADDERALL XR) 20 MG 24 hr capsule    Sig: Take 1 capsule (20 mg total) by mouth daily.    Dispense:  90 capsule    Refill:  0    Follow-up:  3 months  [place] DO [place]

## 2023-02-03 NOTE — Assessment & Plan Note (Signed)
Stable. Adderall refilled.

## 2023-02-25 ENCOUNTER — Other Ambulatory Visit: Payer: Self-pay | Admitting: Family Medicine

## 2023-02-28 ENCOUNTER — Encounter: Payer: Self-pay | Admitting: Family Medicine

## 2023-05-13 ENCOUNTER — Ambulatory Visit: Payer: BC Managed Care – PPO | Admitting: Family Medicine

## 2023-05-13 VITALS — BP 122/81 | HR 84 | Temp 98.1°F | Ht 63.0 in | Wt 187.8 lb

## 2023-05-13 DIAGNOSIS — E785 Hyperlipidemia, unspecified: Secondary | ICD-10-CM

## 2023-05-13 DIAGNOSIS — K219 Gastro-esophageal reflux disease without esophagitis: Secondary | ICD-10-CM

## 2023-05-13 DIAGNOSIS — F902 Attention-deficit hyperactivity disorder, combined type: Secondary | ICD-10-CM | POA: Diagnosis not present

## 2023-05-13 DIAGNOSIS — Z130 Encounter for screening for diseases of the blood and blood-forming organs and certain disorders involving the immune mechanism: Secondary | ICD-10-CM

## 2023-05-13 DIAGNOSIS — R7309 Other abnormal glucose: Secondary | ICD-10-CM | POA: Diagnosis not present

## 2023-05-13 MED ORDER — AMPHETAMINE-DEXTROAMPHET ER 20 MG PO CP24: 20.0000 mg | ORAL_CAPSULE | Freq: Every day | ORAL | 0 refills | Status: DC

## 2023-05-13 MED ORDER — PANTOPRAZOLE SODIUM 40 MG PO TBEC: 40.0000 mg | DELAYED_RELEASE_TABLET | Freq: Every day | ORAL | 3 refills | Status: DC

## 2023-05-13 NOTE — Assessment & Plan Note (Signed)
Stable. Continuing Adderall.

## 2023-05-13 NOTE — Progress Notes (Signed)
Subjective:  Patient ID: [name], female    DOB: 08-16-1966  Age: 57 y.o. MRN: [id number]  CC: Chief Complaint  Patient presents with   Medication Follow Up    Patient has no concerns at this time    HPI:  57 year old female with Migraine, Depression, ADD, GERD presents for follow up.  Patient states that she is currently suffering from carpal tunnel. Improving currently as she is out for summer break (she is a Runner, broadcasting/film/video). Has seen chiropractor.  ADD is stable. She continues to do well on Adderall with no side effects. No hypertension or cardiac history. Plans on retiring at 54. At that point, risk likely to outweigh benefit of treatment.   She is requesting labs today and refill on Adderall and Protonix.   Patient Active Problem List   Diagnosis Date Noted   Raynaud's disease 03/13/2014   ADHD (attention deficit hyperactivity disorder) 05/12/2013   GERD (gastroesophageal reflux disease) 05/12/2013   Migraine headache 05/12/2013   Depression 05/12/2013    Social Hx   Social History   Socioeconomic History   Marital status: Married    Spouse name: Not on file   Number of children: Not on file   Years of education: Not on file   Highest education level: Not on file  Occupational History   Not on file  Tobacco Use   Smoking status: Never   Smokeless tobacco: Never  Vaping Use   Vaping Use: Never used  Substance and Sexual Activity   Alcohol use: Not on file   Drug use: Not on file   Sexual activity: Yes    Birth control/protection: Surgical  Other Topics Concern   Not on file  Social History Narrative   Not on file   Social Determinants of Health   Financial Resource Strain: Not on file  Food Insecurity: Not on file  Transportation Needs: Not on file  Physical Activity: Not on file  Stress: Not on file  Social Connections: Not on file    Review of Systems Per HPI  Objective:  BP 122/81   Pulse 84   Temp 98.1 F (36.7 C)   Ht 5\' 3"   (1.6 m)   Wt 187 lb 12.8 oz (85.2 kg)   SpO2 98%   BMI 33.27 kg/m      05/13/2023   10:48 AM 02/03/2023    1:15 PM 11/03/2022   10:05 AM  BP/Weight  Systolic BP 122 125 100  Diastolic BP 81 85 53  Wt. (Lbs) 187.8 185.8 187.2  BMI 33.27 kg/m2 32.91 kg/m2 33.16 kg/m2    Physical Exam Vitals and nursing note reviewed.  Constitutional:      General: She is not in acute distress.    Appearance: Normal appearance.  HENT:     Head: Normocephalic and atraumatic.  Eyes:     General:        Right eye: No discharge.        Left eye: No discharge.     Conjunctiva/sclera: Conjunctivae normal.  Cardiovascular:     Rate and Rhythm: Normal rate and regular rhythm.  Pulmonary:     Effort: Pulmonary effort is normal.     Breath sounds: Normal breath sounds. No wheezing, rhonchi or rales.  Neurological:     Mental Status: She is alert.  Psychiatric:        Mood and Affect: Mood normal.        Behavior: Behavior normal.  Lab Results  Component Value Date   WBC 6.2 12/25/2020   HGB 13.4 12/25/2020   HCT 41.4 12/25/2020   PLT 235 12/25/2020   GLUCOSE 102 (H) 12/25/2020   CHOL 233 (H) 12/13/2020   TRIG 106 12/13/2020   HDL 69 12/13/2020   LDLCALC 145 (H) 12/13/2020   ALT 13 12/13/2020   AST 16 12/13/2020   NA 139 12/25/2020   K 3.9 12/25/2020   CL 107 12/25/2020   CREATININE 0.95 12/25/2020   BUN 15 12/25/2020   CO2 25 12/25/2020   TSH 2.710 12/13/2020   HGBA1C 5.2 01/06/2015     Assessment & Plan:   Problem List Items Addressed This Visit       Digestive   GERD (gastroesophageal reflux disease)    Stable. Continue Protonix. Refilled today.      Relevant Medications   pantoprazole (PROTONIX) 40 MG tablet     Other   ADHD (attention deficit hyperactivity disorder) - Primary    Stable. Continuing Adderall.       Other Visit Diagnoses     Screening for deficiency anemia       Relevant Orders   CBC   Elevated glucose       Relevant Orders    CMP14+EGFR   Hyperlipidemia, unspecified hyperlipidemia type       Relevant Orders   Lipid panel       Meds ordered this encounter  Medications   pantoprazole (PROTONIX) 40 MG tablet    Sig: Take 1 tablet (40 mg total) by mouth daily.    Dispense:  90 tablet    Refill:  3   amphetamine-dextroamphetamine (ADDERALL XR) 20 MG 24 hr capsule    Sig: Take 1 capsule (20 mg total) by mouth daily.    Dispense:  90 capsule    Refill:  0    Follow-up:  Return in about 3 months (around 08/13/2023).  Everlene Other DO [facility]

## 2023-05-13 NOTE — Patient Instructions (Signed)
If you need a referral for carpal tunnel, please let me know.  I refilled the mediations.  Follow up in 3 months.

## 2023-05-13 NOTE — Assessment & Plan Note (Signed)
Stable. Continue Protonix. Refilled today. 

## 2023-05-20 LAB — CMP14+EGFR
ALT: 18 IU/L (ref 0–32)
AST: 18 IU/L (ref 0–40)
Albumin: 4.4 g/dL (ref 3.8–4.9)
Alkaline Phosphatase: 88 IU/L (ref 44–121)
BUN/Creatinine Ratio: 16 (ref 9–23)
BUN: 14 mg/dL (ref 6–24)
Bilirubin Total: 0.5 mg/dL (ref 0.0–1.2)
CO2: 24 mmol/L (ref 20–29)
Calcium: 9.8 mg/dL (ref 8.7–10.2)
Chloride: 105 mmol/L (ref 96–106)
Creatinine, Ser: 0.89 mg/dL (ref 0.57–1.00)
Globulin, Total: 2 g/dL (ref 1.5–4.5)
Glucose: 96 mg/dL (ref 70–99)
Potassium: 5.2 mmol/L (ref 3.5–5.2)
Sodium: 142 mmol/L (ref 134–144)
Total Protein: 6.4 g/dL (ref 6.0–8.5)
eGFR: 75 mL/min/{1.73_m2} (ref >59)

## 2023-05-20 LAB — CBC
Hematocrit: 43.1 % (ref 34.0–46.6)
Hemoglobin: 14.5 g/dL (ref 11.1–15.9)
MCH: 31.5 pg (ref 26.6–33.0)
MCHC: 33.6 g/dL (ref 31.5–35.7)
MCV: 94 fL (ref 79–97)
Platelets: 323 10*3/uL (ref 150–450)
RBC: 4.61 x10E6/uL (ref 3.77–5.28)
RDW: 12.8 % (ref 11.7–15.4)
WBC: 5.1 10*3/uL (ref 3.4–10.8)

## 2023-05-20 LAB — LIPID PANEL
Chol/HDL Ratio: 3.2 ratio (ref 0.0–4.4)
Cholesterol, Total: 222 mg/dL — ABNORMAL HIGH (ref 100–199)
HDL: 69 mg/dL (ref >39)
LDL Chol Calc (NIH): 135 mg/dL — ABNORMAL HIGH (ref 0–99)
Triglycerides: 102 mg/dL (ref 0–149)
VLDL Cholesterol Cal: 18 mg/dL (ref 5–40)

## 2023-06-05 ENCOUNTER — Other Ambulatory Visit: Payer: Self-pay | Admitting: Family Medicine

## 2023-06-05 DIAGNOSIS — F418 Other specified anxiety disorders: Secondary | ICD-10-CM

## 2023-06-18 ENCOUNTER — Ambulatory Visit: Payer: BC Managed Care – PPO | Admitting: Podiatry

## 2023-08-19 ENCOUNTER — Ambulatory Visit: Payer: BC Managed Care – PPO | Admitting: Family Medicine

## 2023-08-19 VITALS — BP 123/82 | HR 74 | Temp 98.1°F | Wt 188.0 lb

## 2023-08-19 DIAGNOSIS — K219 Gastro-esophageal reflux disease without esophagitis: Secondary | ICD-10-CM

## 2023-08-19 DIAGNOSIS — Z23 Encounter for immunization: Secondary | ICD-10-CM

## 2023-08-19 DIAGNOSIS — F902 Attention-deficit hyperactivity disorder, combined type: Secondary | ICD-10-CM

## 2023-08-19 DIAGNOSIS — F418 Other specified anxiety disorders: Secondary | ICD-10-CM

## 2023-08-19 DIAGNOSIS — F32A Depression, unspecified: Secondary | ICD-10-CM

## 2023-08-19 MED ORDER — BUPROPION HCL ER (XL) 300 MG PO TB24: 300.0000 mg | ORAL_TABLET | Freq: Every day | ORAL | 3 refills | Status: DC

## 2023-08-19 MED ORDER — PANTOPRAZOLE SODIUM 40 MG PO TBEC: 40.0000 mg | DELAYED_RELEASE_TABLET | Freq: Every day | ORAL | 3 refills | Status: DC

## 2023-08-19 MED ORDER — AMPHETAMINE-DEXTROAMPHET ER 20 MG PO CP24: 20.0000 mg | ORAL_CAPSULE | Freq: Every day | ORAL | 0 refills | Status: DC

## 2023-08-19 NOTE — Patient Instructions (Signed)
Meds refilled.  Follow up in 3 months.  Take care  Dr. Adriana Simas

## 2023-08-19 NOTE — Assessment & Plan Note (Signed)
Stable on Adderall XR.  No side effects.  Will continue to monitor closely given age.  Normotensive today.  No chest pain or shortness of breath.

## 2023-08-19 NOTE — Progress Notes (Signed)
Subjective:  Patient ID: [name], female    DOB: June 11, 1966  Age: 57 y.o. MRN: [id number]  CC: Follow up   HPI:  57 year old female with ADD, depression, GERD, migraine, Raynaud's presents for follow-up.  ADD is stable on Adderall XR.  She has no adverse side effects.  Working well.  Needs refill.  Migraines have mostly been stable.  She recently had 1 last week.  Depression stable on Wellbutrin.  GERD stable on Protonix.  Patient Active Problem List   Diagnosis Date Noted   Raynaud's disease 03/13/2014   ADHD (attention deficit hyperactivity disorder) 05/12/2013   GERD (gastroesophageal reflux disease) 05/12/2013   Migraine headache 05/12/2013   Depression 05/12/2013    Social Hx   Social History   Socioeconomic History   Marital status: Married    Spouse name: Not on file   Number of children: Not on file   Years of education: Not on file   Highest education level: Not on file  Occupational History   Not on file  Tobacco Use   Smoking status: Never   Smokeless tobacco: Never  Vaping Use   Vaping status: Never Used  Substance and Sexual Activity   Alcohol use: Not on file   Drug use: Not on file   Sexual activity: Yes    Birth control/protection: Surgical  Other Topics Concern   Not on file  Social History Narrative   Not on file   Social Determinants of Health   Financial Resource Strain: Not on file  Food Insecurity: Not on file  Transportation Needs: Not on file  Physical Activity: Not on file  Stress: Not on file  Social Connections: Not on file    Review of Systems Per HPI  Objective:  BP 123/82   Pulse 74   Temp 98.1 F (36.7 C) (Oral)   Wt 188 lb (85.3 kg)   SpO2 98%   BMI 33.30 kg/m      08/19/2023    8:35 AM 05/13/2023   10:48 AM 02/03/2023    1:15 PM  BP/Weight  Systolic BP 123 122 125  Diastolic BP 82 81 85  Wt. (Lbs) 188 187.8 185.8  BMI 33.3 kg/m2 33.27 kg/m2 32.91 kg/m2    Physical Exam Vitals reviewed.   Constitutional:      General: She is not in acute distress.    Appearance: Normal appearance.  HENT:     Head: Normocephalic and atraumatic.  Eyes:     General:        Right eye: No discharge.        Left eye: No discharge.     Conjunctiva/sclera: Conjunctivae normal.  Cardiovascular:     Rate and Rhythm: Normal rate and regular rhythm.  Pulmonary:     Effort: Pulmonary effort is normal.     Breath sounds: Normal breath sounds. No wheezing, rhonchi or rales.  Neurological:     Mental Status: She is alert.  Psychiatric:        Mood and Affect: Mood normal.        Behavior: Behavior normal.     Lab Results  Component Value Date   WBC 5.1 05/19/2023   HGB 14.5 05/19/2023   HCT 43.1 05/19/2023   PLT 323 05/19/2023   GLUCOSE 96 05/19/2023   CHOL 222 (H) 05/19/2023   TRIG 102 05/19/2023   HDL 69 05/19/2023   LDLCALC 135 (H) 05/19/2023   ALT 18 05/19/2023   AST 18 05/19/2023  NA 142 05/19/2023   K 5.2 05/19/2023   CL 105 05/19/2023   CREATININE 0.89 05/19/2023   BUN 14 05/19/2023   CO2 24 05/19/2023   TSH 2.710 12/13/2020   HGBA1C 5.2 01/06/2015     Assessment & Plan:   Problem List Items Addressed This Visit       Digestive   GERD (gastroesophageal reflux disease)    Stable.  Protonix refilled.      Relevant Medications   pantoprazole (PROTONIX) 40 MG tablet     Other   Depression    Stable.  Wellbutrin refilled.      Relevant Medications   buPROPion (WELLBUTRIN XL) 300 MG 24 hr tablet   ADHD (attention deficit hyperactivity disorder) - Primary    Stable on Adderall XR.  No side effects.  Will continue to monitor closely given age.  Normotensive today.  No chest pain or shortness of breath.      Other Visit Diagnoses     Needs flu shot       Relevant Orders   Flu vaccine trivalent PF, 6mos and older(Flulaval,Afluria,Fluarix,Fluzone) (Completed)   Depression with anxiety       Relevant Medications   buPROPion (WELLBUTRIN XL) 300 MG 24 hr  tablet       Meds ordered this encounter  Medications   buPROPion (WELLBUTRIN XL) 300 MG 24 hr tablet    Sig: Take 1 tablet (300 mg total) by mouth daily.    Dispense:  90 tablet    Refill:  3   pantoprazole (PROTONIX) 40 MG tablet    Sig: Take 1 tablet (40 mg total) by mouth daily.    Dispense:  90 tablet    Refill:  3   amphetamine-dextroamphetamine (ADDERALL XR) 20 MG 24 hr capsule    Sig: Take 1 capsule (20 mg total) by mouth daily.    Dispense:  90 capsule    Refill:  0    Follow-up:  Return in about 3 months (around 11/18/2023).  [name] Other DO [facility]

## 2023-08-19 NOTE — Assessment & Plan Note (Signed)
Stable.  Wellbutrin refilled.

## 2023-08-19 NOTE — Assessment & Plan Note (Signed)
Stable. Protonix refilled.

## 2023-08-27 ENCOUNTER — Other Ambulatory Visit: Payer: Self-pay | Admitting: Family Medicine

## 2023-11-11 ENCOUNTER — Ambulatory Visit: Payer: BC Managed Care – PPO | Admitting: Family Medicine

## 2023-11-13 ENCOUNTER — Ambulatory Visit: Payer: BC Managed Care – PPO | Admitting: Family Medicine

## 2023-11-13 VITALS — BP 112/74 | HR 83 | Temp 97.0°F | Ht 63.0 in | Wt 185.0 lb

## 2023-11-13 DIAGNOSIS — F902 Attention-deficit hyperactivity disorder, combined type: Secondary | ICD-10-CM | POA: Diagnosis not present

## 2023-11-13 MED ORDER — AMPHETAMINE-DEXTROAMPHET ER 20 MG PO CP24: 20.0000 mg | ORAL_CAPSULE | Freq: Every day | ORAL | 0 refills | Status: DC

## 2023-11-13 NOTE — Patient Instructions (Signed)
Continue your medication.  Follow up in 3-6 months.

## 2023-11-16 NOTE — Assessment & Plan Note (Signed)
Stable.  Medication refilled 

## 2023-11-16 NOTE — Progress Notes (Signed)
Subjective:  Patient ID: [name], female    DOB: January 07, 1966  Age: 57 y.o. MRN: [id number]  CC:   Chief Complaint  Patient presents with   ADHD    Medication refills    HPI:  57 year old female presents for follow-up regarding ADHD.  Stable.  Doing well on medication.  Needs refill.  She is happy to be out of work for Christmas vacation.  No side effects on the medication.  No other complaints or concerns at this time.  Patient Active Problem List   Diagnosis Date Noted   Raynaud's disease 03/13/2014   ADHD (attention deficit hyperactivity disorder) 05/12/2013   GERD (gastroesophageal reflux disease) 05/12/2013   Migraine headache 05/12/2013   Depression 05/12/2013    Social Hx   Social History   Socioeconomic History   Marital status: Married    Spouse name: Not on file   Number of children: Not on file   Years of education: Not on file   Highest education level: Not on file  Occupational History   Not on file  Tobacco Use   Smoking status: Never   Smokeless tobacco: Never  Vaping Use   Vaping status: Never Used  Substance and Sexual Activity   Alcohol use: Not on file   Drug use: Not on file   Sexual activity: Yes    Birth control/protection: Surgical  Other Topics Concern   Not on file  Social History Narrative   Not on file   Social Drivers of Health   Financial Resource Strain: Not on file  Food Insecurity: Not on file  Transportation Needs: Not on file  Physical Activity: Not on file  Stress: Not on file  Social Connections: Not on file    Review of Systems Per HPI  Objective:  BP 112/74   Pulse 83   Temp (!) 97 F (36.1 C)   Ht 5\' 3"  (1.6 m)   Wt 185 lb (83.9 kg)   SpO2 97%   BMI 32.77 kg/m      11/13/2023    3:05 PM 08/19/2023    8:35 AM 05/13/2023   10:48 AM  BP/Weight  Systolic BP 112 123 122  Diastolic BP 74 82 81  Wt. (Lbs) 185 188 187.8  BMI 32.77 kg/m2 33.3 kg/m2 33.27 kg/m2    Physical Exam Vitals and  nursing note reviewed.  Constitutional:      General: She is not in acute distress.    Appearance: Normal appearance.  HENT:     Head: Normocephalic and atraumatic.  Eyes:     General:        Right eye: No discharge.        Left eye: No discharge.     Conjunctiva/sclera: Conjunctivae normal.  Cardiovascular:     Rate and Rhythm: Normal rate and regular rhythm.  Pulmonary:     Effort: Pulmonary effort is normal.     Breath sounds: Normal breath sounds. No wheezing, rhonchi or rales.  Neurological:     Mental Status: She is alert.  Psychiatric:        Mood and Affect: Mood normal.        Behavior: Behavior normal.     Lab Results  Component Value Date   WBC 5.1 05/19/2023   HGB 14.5 05/19/2023   HCT 43.1 05/19/2023   PLT 323 05/19/2023   GLUCOSE 96 05/19/2023   CHOL 222 (H) 05/19/2023   TRIG 102 05/19/2023   HDL 69 05/19/2023  LDLCALC 135 (H) 05/19/2023   ALT 18 05/19/2023   AST 18 05/19/2023   NA 142 05/19/2023   K 5.2 05/19/2023   CL 105 05/19/2023   CREATININE 0.89 05/19/2023   BUN 14 05/19/2023   CO2 24 05/19/2023   TSH 2.710 12/13/2020   HGBA1C 5.2 01/06/2015     Assessment & Plan:   Problem List Items Addressed This Visit       Other   ADHD (attention deficit hyperactivity disorder) - Primary   Stable.  Medication refilled.       Meds ordered this encounter  Medications   amphetamine-dextroamphetamine (ADDERALL XR) 20 MG 24 hr capsule    Sig: Take 1 capsule (20 mg total) by mouth daily.    Dispense:  90 capsule    Refill:  0    Follow-up:  6 months  Teresa Vantine Adriana Simas DO [facility]

## 2023-12-10 ENCOUNTER — Other Ambulatory Visit: Payer: Self-pay | Admitting: Family Medicine

## 2023-12-10 ENCOUNTER — Telehealth: Payer: Self-pay | Admitting: Family Medicine

## 2023-12-10 MED ORDER — OSELTAMIVIR PHOSPHATE 75 MG PO CAPS: 75.0000 mg | ORAL_CAPSULE | Freq: Every day | ORAL | 0 refills | Status: DC

## 2023-12-10 NOTE — Telephone Encounter (Signed)
Copied from CRM [id number]. Topic: Clinical - Medication Refill >> Dec 10, 2023  3:14 PM Fuller Mandril wrote: Most Recent Primary Care Visit:  Provider: Tommie Sams  Department: [facility] MED  Visit Type: ACUTE  Date: 11/13/2023  Medication: Tamiflu or Prophylactic   Has the patient contacted their pharmacy? No (Agent: If no, request that the patient contact the pharmacy for the refill. If patient does not wish to contact the pharmacy document the reason why and proceed with request.) (Agent: If yes, when and what did the pharmacy advise?)  Is this the correct pharmacy for this prescription? Yes If no, delete pharmacy and type the correct one.  This is the patient's preferred pharmacy:  [facility] [place], [place] - [id number] [place] [address] Phone: [phone] Fax: [phone]    Has the prescription been filled recently? No  Is the patient out of the medication? Yes  Has the patient been seen for an appointment in the last year OR does the patient have an upcoming appointment? Yes  Can we respond through MyChart? No  Agent: Please be advised that Rx refills may take up to 3 business days. We ask that you follow-up with your pharmacy.

## 2024-02-29 ENCOUNTER — Encounter: Payer: Self-pay | Admitting: Family Medicine

## 2024-02-29 ENCOUNTER — Ambulatory Visit: Admitting: Family Medicine

## 2024-02-29 VITALS — BP 133/84 | HR 87 | Temp 98.2°F | Ht 63.0 in | Wt 185.0 lb

## 2024-02-29 DIAGNOSIS — F902 Attention-deficit hyperactivity disorder, combined type: Secondary | ICD-10-CM

## 2024-02-29 DIAGNOSIS — J0100 Acute maxillary sinusitis, unspecified: Secondary | ICD-10-CM | POA: Diagnosis not present

## 2024-02-29 DIAGNOSIS — J019 Acute sinusitis, unspecified: Secondary | ICD-10-CM | POA: Insufficient documentation

## 2024-02-29 MED ORDER — AMOXICILLIN-POT CLAVULANATE 875-125 MG PO TABS: 1.0000 | ORAL_TABLET | Freq: Two times a day (BID) | ORAL | 0 refills | Status: DC

## 2024-02-29 MED ORDER — AMPHETAMINE-DEXTROAMPHET ER 20 MG PO CP24: 20.0000 mg | ORAL_CAPSULE | Freq: Every day | ORAL | 0 refills | Status: DC

## 2024-02-29 NOTE — Assessment & Plan Note (Signed)
 Treating with Augmentin.

## 2024-02-29 NOTE — Assessment & Plan Note (Signed)
 Advised to watch/avoid caffeine intake.  Adderall refilled.

## 2024-02-29 NOTE — Progress Notes (Signed)
 Subjective:  Patient ID: [name], female    DOB: 17-Jan-1966  Age: 58 y.o. MRN: [id number]  CC:   Chief Complaint  Patient presents with   Sinusitis    Sinus pressure and pain, sinus drainage, earache, coughing.  Worried that allergy symptoms has turned into infection     HPI:  58 year old female presents for evaluation of the above.  Patient reports 1.5-week history of symptoms.  Patient states that she thought this was all related to allergies patient has continued to persist and seems to be worsening.  She reports sinus headache and pressure, sinus pain, ear pain, cough.  Patient believes that she is suffering from sinusitis.  No fever.  No other associated symptoms.  No other complaints.  Additionally, patient states that she needs a refill on her Adderall.  She states that she has recently had some discomfort in the chest.  She states that this occurs with caffeine intake in the setting of Adderall use.  Patient Active Problem List   Diagnosis Date Noted   Acute sinusitis 02/29/2024   Raynaud's disease 03/13/2014   ADHD (attention deficit hyperactivity disorder) 05/12/2013   GERD (gastroesophageal reflux disease) 05/12/2013   Migraine headache 05/12/2013   Depression 05/12/2013    Social Hx   Social History   Socioeconomic History   Marital status: Married    Spouse name: Not on file   Number of children: Not on file   Years of education: Not on file   Highest education level: Not on file  Occupational History   Not on file  Tobacco Use   Smoking status: Never   Smokeless tobacco: Never  Vaping Use   Vaping status: Never Used  Substance and Sexual Activity   Alcohol use: Not on file   Drug use: Not on file   Sexual activity: Yes    Birth control/protection: Surgical  Other Topics Concern   Not on file  Social History Narrative   Not on file   Social Drivers of Health   Financial Resource Strain: Not on file  Food Insecurity: Not on file   Transportation Needs: Not on file  Physical Activity: Not on file  Stress: Not on file  Social Connections: Not on file    Review of Systems Per HPI  Objective:  BP 133/84   Pulse 87   Temp 98.2 F (36.8 C)   Ht 5\' 3"  (1.6 m)   Wt 185 lb (83.9 kg)   SpO2 98%   BMI 32.77 kg/m      02/29/2024    3:36 PM 11/13/2023    3:05 PM 08/19/2023    8:35 AM  BP/Weight  Systolic BP 133 112 123  Diastolic BP 84 74 82  Wt. (Lbs) 185 185 188  BMI 32.77 kg/m2 32.77 kg/m2 33.3 kg/m2    Physical Exam Vitals and nursing note reviewed.  Constitutional:      General: She is not in acute distress.    Appearance: Normal appearance.  HENT:     Head: Normocephalic and atraumatic.     Right Ear: Tympanic membrane normal.     Left Ear: Tympanic membrane normal.     Nose:     Comments: Maxillary sinus tenderness to palpation.    Mouth/Throat:     Pharynx: Oropharynx is clear.  Cardiovascular:     Rate and Rhythm: Normal rate and regular rhythm.  Pulmonary:     Effort: Pulmonary effort is normal.     Breath sounds: Normal  breath sounds.  Neurological:     Mental Status: She is alert.     Lab Results  Component Value Date   WBC 5.1 05/19/2023   HGB 14.5 05/19/2023   HCT 43.1 05/19/2023   PLT 323 05/19/2023   GLUCOSE 96 05/19/2023   CHOL 222 (H) 05/19/2023   TRIG 102 05/19/2023   HDL 69 05/19/2023   LDLCALC 135 (H) 05/19/2023   ALT 18 05/19/2023   AST 18 05/19/2023   NA 142 05/19/2023   K 5.2 05/19/2023   CL 105 05/19/2023   CREATININE 0.89 05/19/2023   BUN 14 05/19/2023   CO2 24 05/19/2023   TSH 2.710 12/13/2020   HGBA1C 5.2 01/06/2015     Assessment & Plan:  Acute maxillary sinusitis, recurrence not specified Assessment & Plan: Treating with Augmentin   Attention deficit hyperactivity disorder (ADHD), combined type Assessment & Plan: Advised to watch/avoid caffeine intake.  Adderall refilled.   Other orders -     Amoxicillin-Pot Clavulanate; Take 1 tablet by  mouth 2 (two) times daily.  Dispense: 20 tablet; Refill: 0 -     Amphetamine-Dextroamphet ER; Take 1 capsule (20 mg total) by mouth daily.  Dispense: 90 capsule; Refill: 0    Follow-up:  Return if symptoms worsen or fail to improve.  Everlene Other DO [facility]

## 2024-05-13 ENCOUNTER — Ambulatory Visit: Payer: BC Managed Care – PPO | Admitting: Family Medicine

## 2024-05-13 VITALS — BP 114/74 | HR 80 | Temp 98.1°F | Ht 63.0 in | Wt 188.2 lb

## 2024-05-13 DIAGNOSIS — F902 Attention-deficit hyperactivity disorder, combined type: Secondary | ICD-10-CM | POA: Diagnosis not present

## 2024-05-13 DIAGNOSIS — E66811 Obesity, class 1: Secondary | ICD-10-CM | POA: Diagnosis not present

## 2024-05-13 MED ORDER — SEMAGLUTIDE-WEIGHT MANAGEMENT 0.25 MG/0.5ML ~~LOC~~ SOAJ: 0.2500 mg | SUBCUTANEOUS | 0 refills | Status: DC

## 2024-05-13 MED ORDER — AMPHETAMINE-DEXTROAMPHET ER 20 MG PO CP24: 20.0000 mg | ORAL_CAPSULE | Freq: Every day | ORAL | 0 refills | Status: DC

## 2024-05-13 NOTE — Patient Instructions (Signed)
 Medications sent in.  Follow up in 6 months.  Take care  Dr. Adriana Simas

## 2024-05-15 DIAGNOSIS — E66811 Obesity, class 1: Secondary | ICD-10-CM | POA: Insufficient documentation

## 2024-05-15 NOTE — Assessment & Plan Note (Signed)
 Discussed side effects, dosing, etc. Rx sent for Wegovy .

## 2024-05-15 NOTE — Assessment & Plan Note (Signed)
Stable.  Medication refilled 

## 2024-05-15 NOTE — Progress Notes (Signed)
 Subjective:  Patient ID: [name], female    DOB: 1966-08-10  Age: 58 y.o. MRN: [id number]  CC:   Follow up  HPI:  58 year old female presents for follow up.  Overall doing well. ADD stable. Needs medication refill.  Would like to discuss GLP-1 medication today (regarding weight).  Patient Active Problem List   Diagnosis Date Noted   Obesity (BMI 30.0-34.9) 05/15/2024   Raynaud's disease 03/13/2014   ADHD (attention deficit hyperactivity disorder) 05/12/2013   GERD (gastroesophageal reflux disease) 05/12/2013   Migraine headache 05/12/2013   Depression 05/12/2013    Social Hx   Social History   Socioeconomic History   Marital status: Married    Spouse name: Not on file   Number of children: Not on file   Years of education: Not on file   Highest education level: Not on file  Occupational History   Not on file  Tobacco Use   Smoking status: Never   Smokeless tobacco: Never  Vaping Use   Vaping status: Never Used  Substance and Sexual Activity   Alcohol use: Not on file   Drug use: Not on file   Sexual activity: Yes    Birth control/protection: Surgical  Other Topics Concern   Not on file  Social History Narrative   Not on file   Social Drivers of Health   Financial Resource Strain: Not on file  Food Insecurity: Not on file  Transportation Needs: Not on file  Physical Activity: Not on file  Stress: Not on file  Social Connections: Not on file    Review of Systems  Respiratory: Negative.    Cardiovascular: Negative.     Objective:  BP 114/74   Pulse 80   Temp 98.1 F (36.7 C)   Ht 5' 3 (1.6 m)   Wt 188 lb 3.2 oz (85.4 kg)   SpO2 98%   BMI 33.34 kg/m      05/13/2024    8:59 AM 02/29/2024    3:36 PM 11/13/2023    3:05 PM  BP/Weight  Systolic BP 114 133 112  Diastolic BP 74 84 74  Wt. (Lbs) 188.2 185 185  BMI 33.34 kg/m2 32.77 kg/m2 32.77 kg/m2    Physical Exam Vitals and nursing note reviewed.  Constitutional:       General: She is not in acute distress.    Appearance: Normal appearance.  HENT:     Head: Normocephalic and atraumatic.   Eyes:     General:        Right eye: No discharge.        Left eye: No discharge.     Conjunctiva/sclera: Conjunctivae normal.    Cardiovascular:     Rate and Rhythm: Normal rate and regular rhythm.  Pulmonary:     Effort: Pulmonary effort is normal.     Breath sounds: Normal breath sounds. No wheezing, rhonchi or rales.   Neurological:     Mental Status: She is alert.   Psychiatric:        Mood and Affect: Mood normal.        Behavior: Behavior normal.     Lab Results  Component Value Date   WBC 5.1 05/19/2023   HGB 14.5 05/19/2023   HCT 43.1 05/19/2023   PLT 323 05/19/2023   GLUCOSE 96 05/19/2023   CHOL 222 (H) 05/19/2023   TRIG 102 05/19/2023   HDL 69 05/19/2023   LDLCALC 135 (H) 05/19/2023   ALT 18 05/19/2023  AST 18 05/19/2023   NA 142 05/19/2023   K 5.2 05/19/2023   CL 105 05/19/2023   CREATININE 0.89 05/19/2023   BUN 14 05/19/2023   CO2 24 05/19/2023   TSH 2.710 12/13/2020   HGBA1C 5.2 01/06/2015     Assessment & Plan:  Attention deficit hyperactivity disorder (ADHD), combined type Assessment & Plan: Stable. Medication refilled.  Orders: -     Amphetamine -Dextroamphet ER; Take 1 capsule (20 mg total) by mouth daily.  Dispense: 90 capsule; Refill: 0  Obesity (BMI 30.0-34.9) Assessment & Plan: Discussed side effects, dosing, etc. Rx sent for Wegovy .  Orders: -     Semaglutide -Weight Management; Inject 0.25 mg into the skin once a week.  Dispense: 2 mL; Refill: 0    Follow-up:  6 months  Smayan Hackbart Bluford DO [facility]

## 2024-05-31 LAB — HM COLONOSCOPY

## 2024-08-09 ENCOUNTER — Other Ambulatory Visit: Payer: Self-pay | Admitting: Family Medicine

## 2024-08-09 ENCOUNTER — Telehealth: Payer: Self-pay | Admitting: Family Medicine

## 2024-08-09 ENCOUNTER — Other Ambulatory Visit: Payer: Self-pay | Admitting: Nurse Practitioner

## 2024-08-09 DIAGNOSIS — F418 Other specified anxiety disorders: Secondary | ICD-10-CM

## 2024-08-09 MED ORDER — BUPROPION HCL ER (XL) 300 MG PO TB24: 300.0000 mg | ORAL_TABLET | Freq: Every day | ORAL | 1 refills | Status: AC

## 2024-08-09 NOTE — Telephone Encounter (Signed)
 Done

## 2024-08-09 NOTE — Telephone Encounter (Signed)
 Refill on buPROPion  (WELLBUTRIN  XL) 300 MG 24 hr tablet   [facility]

## 2024-09-17 ENCOUNTER — Other Ambulatory Visit: Payer: Self-pay | Admitting: Family Medicine

## 2024-09-17 DIAGNOSIS — F902 Attention-deficit hyperactivity disorder, combined type: Secondary | ICD-10-CM

## 2024-11-14 ENCOUNTER — Ambulatory Visit: Admitting: Family Medicine

## 2024-11-15 ENCOUNTER — Encounter: Payer: Self-pay | Admitting: Pharmacy Technician

## 2024-11-15 ENCOUNTER — Other Ambulatory Visit (HOSPITAL_COMMUNITY): Payer: Self-pay

## 2024-11-15 ENCOUNTER — Ambulatory Visit: Admitting: Family Medicine

## 2024-11-15 ENCOUNTER — Encounter: Payer: Self-pay | Admitting: Family Medicine

## 2024-11-15 VITALS — BP 121/82 | HR 80 | Temp 98.1°F | Ht 63.0 in | Wt 187.6 lb

## 2024-11-15 DIAGNOSIS — Z130 Encounter for screening for diseases of the blood and blood-forming organs and certain disorders involving the immune mechanism: Secondary | ICD-10-CM

## 2024-11-15 DIAGNOSIS — G43009 Migraine without aura, not intractable, without status migrainosus: Secondary | ICD-10-CM | POA: Diagnosis not present

## 2024-11-15 DIAGNOSIS — E7800 Pure hypercholesterolemia, unspecified: Secondary | ICD-10-CM | POA: Diagnosis not present

## 2024-11-15 DIAGNOSIS — F902 Attention-deficit hyperactivity disorder, combined type: Secondary | ICD-10-CM

## 2024-11-15 DIAGNOSIS — F32A Depression, unspecified: Secondary | ICD-10-CM | POA: Diagnosis not present

## 2024-11-15 DIAGNOSIS — E66811 Obesity, class 1: Secondary | ICD-10-CM

## 2024-11-15 MED ORDER — AMPHETAMINE-DEXTROAMPHET ER 10 MG PO CP24: 10.0000 mg | ORAL_CAPSULE | Freq: Every day | ORAL | 0 refills | Status: DC

## 2024-11-15 MED ORDER — AMPHETAMINE-DEXTROAMPHET ER 15 MG PO CP24: 15.0000 mg | ORAL_CAPSULE | Freq: Every day | ORAL | 0 refills | Status: AC

## 2024-11-15 NOTE — Assessment & Plan Note (Signed)
 Stable on Wellbutrin

## 2024-11-15 NOTE — Patient Instructions (Signed)
 Labs ordered.  Medication decreased.  Follow up in 3 months.

## 2024-11-15 NOTE — Assessment & Plan Note (Signed)
 Stable

## 2024-11-15 NOTE — Progress Notes (Signed)
 "  Subjective:  Patient ID: [name], female    DOB: 1966/07/28  Age: 58 y.o. MRN: [id number]  CC:   Chief Complaint  Patient presents with   Follow-up    6 mo f/u     HPI:  58 year old female presents for follow-up.  Overall doing well.  Will be retiring effective January 1.  She is currently on Adderall XR 20 mg daily.  Will discuss decreasing dose and discontinuation given the fact that she will no longer be in the workforce and also due to her age.  Depression stable on Wellbutrin .  Migraine headaches stable.  Patient reports that her mammograms are done by her OB/GYN in [place].  Need records.  She states that this is up-to-date.  Patient Active Problem List   Diagnosis Date Noted   Obesity (BMI 30.0-34.9) 05/15/2024   Raynaud's disease 03/13/2014   ADHD (attention deficit hyperactivity disorder) 05/12/2013   GERD (gastroesophageal reflux disease) 05/12/2013   Migraine headache 05/12/2013   Depression 05/12/2013    Social Hx   Social History   Socioeconomic History   Marital status: Married    Spouse name: Not on file   Number of children: Not on file   Years of education: Not on file   Highest education level: Not on file  Occupational History   Not on file  Tobacco Use   Smoking status: Never   Smokeless tobacco: Never  Vaping Use   Vaping status: Never Used  Substance and Sexual Activity   Alcohol use: Not on file   Drug use: Not on file   Sexual activity: Yes    Birth control/protection: Surgical  Other Topics Concern   Not on file  Social History Narrative   Not on file   Social Drivers of Health   Tobacco Use: Low Risk (11/15/2024)   Patient History    Smoking Tobacco Use: Never    Smokeless Tobacco Use: Never    Passive Exposure: Not on file  Financial Resource Strain: Not on file  Food Insecurity: Not on file  Transportation Needs: Not on file  Physical Activity: Not on file  Stress: Not on file  Social Connections: Not  on file  Depression (PHQ2-9): Low Risk (11/15/2024)   Depression (PHQ2-9)    PHQ-2 Score: 2  Alcohol Screen: Not on file  Housing: Not on file  Utilities: Not on file  Health Literacy: Not on file    Review of Systems Per HPI  Objective:  BP 121/82   Pulse 80   Temp 98.1 F (36.7 C)   Ht 5' 3 (1.6 m)   Wt 187 lb 9.6 oz (85.1 kg)   SpO2 95%   BMI 33.23 kg/m      11/15/2024    8:35 AM 05/13/2024    8:59 AM 02/29/2024    3:36 PM  BP/Weight  Systolic BP 121 114 133  Diastolic BP 82 74 84  Wt. (Lbs) 187.6 188.2 185  BMI 33.23 kg/m2 33.34 kg/m2 32.77 kg/m2    Physical Exam Vitals and nursing note reviewed.  Constitutional:      General: She is not in acute distress.    Appearance: Normal appearance.  HENT:     Head: Normocephalic and atraumatic.  Eyes:     General:        Right eye: No discharge.        Left eye: No discharge.     Conjunctiva/sclera: Conjunctivae normal.  Cardiovascular:     Rate and  Rhythm: Normal rate and regular rhythm.  Pulmonary:     Effort: Pulmonary effort is normal.     Breath sounds: Normal breath sounds. No wheezing, rhonchi or rales.  Neurological:     Mental Status: She is alert.  Psychiatric:        Mood and Affect: Mood normal.        Behavior: Behavior normal.     Lab Results  Component Value Date   WBC 5.1 05/19/2023   HGB 14.5 05/19/2023   HCT 43.1 05/19/2023   PLT 323 05/19/2023   GLUCOSE 96 05/19/2023   CHOL 222 (H) 05/19/2023   TRIG 102 05/19/2023   HDL 69 05/19/2023   LDLCALC 135 (H) 05/19/2023   ALT 18 05/19/2023   AST 18 05/19/2023   NA 142 05/19/2023   K 5.2 05/19/2023   CL 105 05/19/2023   CREATININE 0.89 05/19/2023   BUN 14 05/19/2023   CO2 24 05/19/2023   TSH 2.710 12/13/2020   HGBA1C 5.2 01/06/2015     Assessment & Plan:  Attention deficit hyperactivity disorder (ADHD), combined type Assessment & Plan: Stable.  Decreasing dose.  Planning to discontinue in the near future.  Orders: -      Amphetamine -Dextroamphet ER; Take 1 capsule by mouth daily.  Dispense: 90 capsule; Refill: 0  Pure hypercholesterolemia -     Lipid panel  Screening for deficiency anemia -     CBC  Obesity (BMI 30.0-34.9) -     CMP14+EGFR  Migraine without aura and without status migrainosus, not intractable Assessment & Plan: Stable.   Depression, unspecified depression type Assessment & Plan: Stable on Wellbutrin .     Follow-up:  3 months  Charitie Hinote Bluford DO [facility] "

## 2024-11-15 NOTE — Assessment & Plan Note (Signed)
 Stable.  Decreasing dose.  Planning to discontinue in the near future.

## 2024-11-15 NOTE — Telephone Encounter (Signed)
 error

## 2024-11-21 ENCOUNTER — Other Ambulatory Visit: Payer: Self-pay | Admitting: Family Medicine

## 2024-11-21 ENCOUNTER — Encounter: Payer: Self-pay | Admitting: Family Medicine

## 2024-11-21 MED ORDER — OSELTAMIVIR PHOSPHATE 75 MG PO CAPS: 75.0000 mg | ORAL_CAPSULE | Freq: Two times a day (BID) | ORAL | 0 refills | Status: AC

## 2025-02-15 ENCOUNTER — Ambulatory Visit: Admitting: Family Medicine
